# Patient Record
Sex: Female | Born: 1990 | Race: White | Hispanic: No | Marital: Married | Smoking: Current every day smoker
Health system: Southern US, Community
[De-identification: ages and names within clinical notes are randomized; demographics above are authoritative.]

## PROBLEM LIST (undated history)

## (undated) DIAGNOSIS — K219 Gastro-esophageal reflux disease without esophagitis: Secondary | ICD-10-CM

## (undated) DIAGNOSIS — I2699 Other pulmonary embolism without acute cor pulmonale: Secondary | ICD-10-CM

## (undated) HISTORY — PX: CHOLECYSTECTOMY: SHX55

---

## 2011-02-27 ENCOUNTER — Emergency Department: Payer: Self-pay | Admitting: Emergency Medicine

## 2012-10-09 ENCOUNTER — Emergency Department: Payer: Self-pay | Admitting: Emergency Medicine

## 2012-10-09 LAB — URINALYSIS, COMPLETE
Bilirubin,UR: NEGATIVE
Blood: NEGATIVE
Leukocyte Esterase: NEGATIVE
Nitrite: NEGATIVE
Protein: NEGATIVE
Squamous Epithelial: <1
WBC UR: 1 /HPF (ref 0–5)

## 2012-10-09 LAB — COMPREHENSIVE METABOLIC PANEL
Alkaline Phosphatase: 84 U/L (ref 50–136)
Anion Gap: 9 (ref 7–16)
BUN: 13 mg/dL (ref 7–18)
Bilirubin,Total: <0.1 mg/dL — ABNORMAL LOW (ref 0.2–1.0)
Calcium, Total: 8.5 mg/dL (ref 8.5–10.1)
Co2: 20 mmol/L — ABNORMAL LOW (ref 21–32)
Creatinine: 0.79 mg/dL (ref 0.60–1.30)
EGFR (African American): >60
EGFR (Non-African Amer.): >60
Glucose: 92 mg/dL (ref 65–99)
Potassium: 4.1 mmol/L (ref 3.5–5.1)
SGOT(AST): 23 U/L (ref 15–37)
SGPT (ALT): 32 U/L (ref 12–78)
Total Protein: 7.4 g/dL (ref 6.4–8.2)

## 2012-10-09 LAB — CBC
HCT: 39.3 % (ref 35.0–47.0)
HGB: 13.5 g/dL (ref 12.0–16.0)
MCV: 88 fL (ref 80–100)

## 2012-10-09 LAB — LIPASE, BLOOD: Lipase: 109 U/L (ref 73–393)

## 2013-07-07 ENCOUNTER — Emergency Department: Payer: Self-pay | Admitting: Emergency Medicine

## 2013-07-07 LAB — CBC
HGB: 13 g/dL (ref 12.0–16.0)
MCH: 30.3 pg (ref 26.0–34.0)
MCV: 87 fL (ref 80–100)
Platelet: 322 10*3/uL (ref 150–440)
RBC: 4.28 10*6/uL (ref 3.80–5.20)
WBC: 13.2 10*3/uL — ABNORMAL HIGH (ref 3.6–11.0)

## 2013-07-07 LAB — COMPREHENSIVE METABOLIC PANEL
Alkaline Phosphatase: 91 U/L (ref 50–136)
Anion Gap: 7 (ref 7–16)
BUN: 15 mg/dL (ref 7–18)
Calcium, Total: 8.8 mg/dL (ref 8.5–10.1)
Co2: 21 mmol/L (ref 21–32)
EGFR (Non-African Amer.): >60
Glucose: 125 mg/dL — ABNORMAL HIGH (ref 65–99)
Potassium: 4 mmol/L (ref 3.5–5.1)
SGOT(AST): 24 U/L (ref 15–37)
SGPT (ALT): 23 U/L (ref 12–78)
Sodium: 139 mmol/L (ref 136–145)

## 2013-07-07 LAB — URINALYSIS, COMPLETE
Bilirubin,UR: NEGATIVE
Glucose,UR: NEGATIVE mg/dL (ref 0–75)
Ketone: NEGATIVE
Ph: 5 (ref 4.5–8.0)
Protein: NEGATIVE
RBC,UR: 1 /HPF (ref 0–5)
Specific Gravity: 1.028 (ref 1.003–1.030)
WBC UR: <1 /HPF (ref 0–5)

## 2013-07-07 LAB — PREGNANCY, URINE: Pregnancy Test, Urine: NEGATIVE m[IU]/mL

## 2016-12-28 ENCOUNTER — Emergency Department
Admission: EM | Admit: 2016-12-28 | Discharge: 2016-12-28 | Disposition: A | Discharge status: Home / Self Care | Payer: Self-pay | Attending: Emergency Medicine | Admitting: Emergency Medicine

## 2016-12-28 ENCOUNTER — Emergency Department: Payer: Self-pay

## 2016-12-28 DIAGNOSIS — N200 Calculus of kidney: Secondary | ICD-10-CM | POA: Insufficient documentation

## 2016-12-28 DIAGNOSIS — F17210 Nicotine dependence, cigarettes, uncomplicated: Secondary | ICD-10-CM | POA: Insufficient documentation

## 2016-12-28 HISTORY — DX: Gastro-esophageal reflux disease without esophagitis: K21.9

## 2016-12-28 LAB — URINALYSIS, COMPLETE (UACMP) WITH MICROSCOPIC
Bacteria, UA: NONE SEEN
Bilirubin Urine: NEGATIVE
GLUCOSE, UA: NEGATIVE mg/dL
Ketones, ur: 5 mg/dL — AB
Leukocytes, UA: NEGATIVE
NITRITE: NEGATIVE
PH: 5 (ref 5.0–8.0)
Protein, ur: 100 mg/dL — AB
Specific Gravity, Urine: 1.026 (ref 1.005–1.030)

## 2016-12-28 LAB — BASIC METABOLIC PANEL
Anion gap: 9 (ref 5–15)
BUN: 14 mg/dL (ref 6–20)
CALCIUM: 9.8 mg/dL (ref 8.9–10.3)
CO2: 21 mmol/L — AB (ref 22–32)
CREATININE: 0.98 mg/dL (ref 0.44–1.00)
Chloride: 108 mmol/L (ref 101–111)
GFR calc Af Amer: >60 mL/min (ref >60)
GFR calc non Af Amer: >60 mL/min (ref >60)
GLUCOSE: 118 mg/dL — AB (ref 65–99)
Potassium: 4.2 mmol/L (ref 3.5–5.1)
Sodium: 138 mmol/L (ref 135–145)

## 2016-12-28 LAB — CBC WITH DIFFERENTIAL/PLATELET
Basophils Absolute: 0.1 10*3/uL (ref 0–0.1)
Basophils Relative: 1 %
EOS PCT: 2 %
Eosinophils Absolute: 0.2 10*3/uL (ref 0–0.7)
HEMATOCRIT: 42.2 % (ref 35.0–47.0)
Hemoglobin: 14.6 g/dL (ref 12.0–16.0)
LYMPHS PCT: 24 %
Lymphs Abs: 3 10*3/uL (ref 1.0–3.6)
MCH: 31.4 pg (ref 26.0–34.0)
MCHC: 34.7 g/dL (ref 32.0–36.0)
MCV: 90.5 fL (ref 80.0–100.0)
MONO ABS: 0.7 10*3/uL (ref 0.2–0.9)
MONOS PCT: 5 %
NEUTROS ABS: 9 10*3/uL — AB (ref 1.4–6.5)
Neutrophils Relative %: 68 %
Platelets: 363 10*3/uL (ref 150–440)
RBC: 4.67 MIL/uL (ref 3.80–5.20)
RDW: 13.5 % (ref 11.5–14.5)
WBC: 13 10*3/uL — ABNORMAL HIGH (ref 3.6–11.0)

## 2016-12-28 LAB — PREGNANCY, URINE: Preg Test, Ur: NEGATIVE

## 2016-12-28 MED ORDER — OXYCODONE-ACETAMINOPHEN 5-325 MG PO TABS
2.0000 | ORAL_TABLET | Freq: Once | ORAL | Status: AC
  Administered 2016-12-28: 2 via ORAL
  Filled 2016-12-28: qty 2

## 2016-12-28 MED ORDER — SODIUM CHLORIDE 0.9 % IV BOLUS (SEPSIS)
1000.0000 mL | Freq: Once | INTRAVENOUS | Status: AC
  Administered 2016-12-28: 1000 mL via INTRAVENOUS

## 2016-12-28 MED ORDER — KETOROLAC TROMETHAMINE 30 MG/ML IJ SOLN
30.0000 mg | Freq: Once | INTRAMUSCULAR | Status: AC
  Administered 2016-12-28: 30 mg via INTRAVENOUS
  Filled 2016-12-28: qty 1

## 2016-12-28 MED ORDER — HYDROMORPHONE HCL 1 MG/ML IJ SOLN
1.0000 mg | Freq: Once | INTRAMUSCULAR | Status: AC
  Administered 2016-12-28: 1 mg via INTRAVENOUS
  Filled 2016-12-28: qty 1

## 2016-12-28 MED ORDER — OXYCODONE-ACETAMINOPHEN 5-325 MG PO TABS: 1.0000 | ORAL_TABLET | Freq: Four times a day (QID) | ORAL | 0 refills | Status: DC | PRN

## 2016-12-28 MED ORDER — LIDOCAINE HCL (CARDIAC) 20 MG/ML IV SOLN
1.5000 mg/kg | Freq: Once | INTRAVENOUS | Status: AC
  Administered 2016-12-28: 176.8 mg via INTRAVENOUS
  Filled 2016-12-28: qty 10

## 2016-12-28 MED ORDER — TAMSULOSIN HCL 0.4 MG PO CAPS: 0.4000 mg | ORAL_CAPSULE | Freq: Every day | ORAL | 0 refills | Status: DC

## 2016-12-28 MED ORDER — ONDANSETRON HCL 4 MG/2ML IJ SOLN
4.0000 mg | Freq: Once | INTRAMUSCULAR | Status: AC
  Administered 2016-12-28: 4 mg via INTRAVENOUS
  Filled 2016-12-28: qty 2

## 2016-12-28 MED ORDER — ONDANSETRON 4 MG PO TBDP: 4.0000 mg | ORAL_TABLET | Freq: Three times a day (TID) | ORAL | 0 refills | Status: DC | PRN

## 2016-12-28 NOTE — Discharge Instructions (Signed)
Please drink plenty of fluids, take your medications as needed, as prescribed. Please follow-up with urology in 1 week if she continued to have any discomfort. Return to the emergency department for any increase in pain, fever, or pain with urination.

## 2016-12-28 NOTE — ED Provider Notes (Signed)
[facility] Emergency Department Provider Note  Time seen: 3:54 PM  I have reviewed the triage vital signs and the nursing notes.   HISTORY  Chief Complaint Flank Pain    HPI Kristina Crosby is a 26 y.o. female with a past medical history of gastric reflux who presents to the emergency department with sharp severe right flank pain. According to the patient approximately 1-2 hours prior to arrival she developed sudden onset of sharp 10/10 pain to her right flank radiating into the right groin. Denies any similar pain in the past. Patient is status post cholecystectomy. Denies any known fever. Has not urinated yet. Patient is currently pacing around the room due to the pain. Patient states significant nausea as well.  Past Medical History:  Diagnosis Date  . GERD (gastroesophageal reflux disease)     There are no active problems to display for this patient.   Past Surgical History:  Procedure Laterality Date  . CHOLECYSTECTOMY      Prior to Admission medications   Not on File    No Known Allergies  Family History  Problem Relation Age of Onset  . Diabetes Father     Social History Social History  Substance Use Topics  . Smoking status: Current Every Day Smoker    Types: Cigarettes  . Smokeless tobacco: Never Used  . Alcohol use No    Review of Systems Constitutional: Negative for fever Cardiovascular: Negative for chest pain. Respiratory: Negative for shortness of breath. Gastrointestinal: Right flank pain. Positive for nausea. Negative for vomiting. Patient does state intermittent diarrhea over the past 2-3 days. Genitourinary: Negative for dysuria. However the patient has not urinated since the pain began. Musculoskeletal: Positive for right back pain Neurological: Negative for headache 10-point ROS otherwise negative.  ____________________________________________   PHYSICAL EXAM:  VITAL SIGNS: ED Triage Vitals  Enc Vitals  Group     BP 12/28/16 1436 (!) 156/87     Pulse Rate 12/28/16 1436 90     Resp 12/28/16 1436 20     Temp 12/28/16 1436 98.4 F (36.9 C)     Temp Source 12/28/16 1436 Oral     SpO2 12/28/16 1436 98 %     Weight 12/28/16 1437 260 lb (117.9 kg)     Height 12/28/16 1437  (1.6 m)     Head Circumference --      Peak Flow --      Pain Score 12/28/16 1436 10     Pain Loc --      Pain Edu? --      Excl. in GC? --     Constitutional: Alert and oriented. Moderate distress due to pain. Eyes: Normal exam ENT   Head: Normocephalic and atraumatic.   Mouth/Throat: Mucous membranes are moist. Cardiovascular: Normal rate, regular rhythm. No murmur Respiratory: Normal respiratory effort without tachypnea nor retractions. Breath sounds are clear  Gastrointestinal: Soft, mild right lower and upper quadrant tenderness to palpation. No rebound or guarding. No distention. No CVA tenderness. Musculoskeletal: Nontender with normal range of motion in all extremities.  Neurologic:  Normal speech and language. No gross focal neurologic deficits  Skin:  Skin is warm, dry and intact.  Psychiatric: Mood and affect are normal. Speech and behavior are normal.   ____________________________________________   RADIOLOGY  IMPRESSION: 1. Moderate right hydronephrosis and hydroureter associated with a 2 mm right distal ureteral calculus located 1.8 cm proximal to the right UVJ. No other renal calculi. 2. Diffuse hepatic steatosis.  3. There are a few scattered diverticula of the transverse colon, not inflamed.  ____________________________________________   INITIAL IMPRESSION / ASSESSMENT AND PLAN / ED COURSE  Pertinent labs & imaging results that were available during my care of the patient were reviewed by me and considered in my medical decision making (see chart for details).  Patient presents the emergency department for significant right flank pain starting 1-2 hours prior to arrival.  Patient's symptoms are very suggestive of ureteral lithiasis although no history. We will check labs, CT renal scan and closely monitor. We'll dose pain and nausea medication as well as IV hydrate while awaiting results. Patient agreeable to plan.  Labs show significant hematuria again suggestive of ureterolithiasis. Urine pregnancy test is been added on to the patient's lab work. CT pending.  CT consistent with 2 mm right distal ureteral stone. Patient's pain much improved after Toradol and IV lidocaine. We will discharge with Percocet, Zofran and Flomax. Patient agreeable. ____________________________________________   FINAL CLINICAL IMPRESSION(S) / ED DIAGNOSES  Right flank pain    Minna Antis, MD 12/28/16 1909

## 2016-12-28 NOTE — ED Triage Notes (Signed)
Pt to ED via pov c/o right flank pain. Pt reports right sided flank pain that began suddenly one hour ago, and radiates from back to RLQ. Pt reports nausea denies vomiting, and diarrhea x3 days. Pt tearful and restless during triage, in no acute distress at this time.

## 2016-12-28 NOTE — ED Notes (Signed)
Pt tearful and pacing the floor while guarding the right side of her back.

## 2016-12-28 NOTE — ED Notes (Signed)
Pt discharged to home.  Significant other driving.  Discharge instructions reviewed.  Verbalized understanding.  No questions or concerns at this time.  Teach back verified.  Pt in NAD.  No items left in ED.   

## 2016-12-30 LAB — URINE CULTURE

## 2017-01-08 ENCOUNTER — Emergency Department
Admission: EM | Admit: 2017-01-08 | Discharge: 2017-01-08 | Disposition: A | Discharge status: Home / Self Care | Payer: Self-pay | Attending: Emergency Medicine | Admitting: Emergency Medicine

## 2017-01-08 ENCOUNTER — Emergency Department: Payer: Self-pay

## 2017-01-08 DIAGNOSIS — F17210 Nicotine dependence, cigarettes, uncomplicated: Secondary | ICD-10-CM | POA: Insufficient documentation

## 2017-01-08 DIAGNOSIS — N1339 Other hydronephrosis: Secondary | ICD-10-CM | POA: Insufficient documentation

## 2017-01-08 DIAGNOSIS — R109 Unspecified abdominal pain: Secondary | ICD-10-CM

## 2017-01-08 LAB — BASIC METABOLIC PANEL
Anion gap: 10 (ref 5–15)
BUN: 14 mg/dL (ref 6–20)
CHLORIDE: 98 mmol/L — AB (ref 101–111)
CO2: 24 mmol/L (ref 22–32)
Calcium: 9.4 mg/dL (ref 8.9–10.3)
Creatinine, Ser: 1.24 mg/dL — ABNORMAL HIGH (ref 0.44–1.00)
Glucose, Bld: 108 mg/dL — ABNORMAL HIGH (ref 65–99)
POTASSIUM: 3.4 mmol/L — AB (ref 3.5–5.1)
SODIUM: 132 mmol/L — AB (ref 135–145)

## 2017-01-08 LAB — CBC
HEMATOCRIT: 41.1 % (ref 35.0–47.0)
Hemoglobin: 14.3 g/dL (ref 12.0–16.0)
MCH: 31 pg (ref 26.0–34.0)
MCHC: 34.7 g/dL (ref 32.0–36.0)
MCV: 89.3 fL (ref 80.0–100.0)
PLATELETS: 360 10*3/uL (ref 150–440)
RBC: 4.6 MIL/uL (ref 3.80–5.20)
RDW: 13.3 % (ref 11.5–14.5)
WBC: 13 10*3/uL — AB (ref 3.6–11.0)

## 2017-01-08 MED ORDER — OXYCODONE-ACETAMINOPHEN 5-325 MG PO TABS
1.0000 | ORAL_TABLET | Freq: Once | ORAL | Status: AC
  Administered 2017-01-08: 1 via ORAL
  Filled 2017-01-08: qty 1

## 2017-01-08 MED ORDER — ONDANSETRON HCL 4 MG/2ML IJ SOLN
INTRAMUSCULAR | Status: AC
  Administered 2017-01-08: 4 mg via INTRAVENOUS
  Filled 2017-01-08: qty 2

## 2017-01-08 MED ORDER — OXYCODONE-ACETAMINOPHEN 5-325 MG PO TABS: 1.0000 | ORAL_TABLET | Freq: Four times a day (QID) | ORAL | 0 refills | Status: DC | PRN

## 2017-01-08 MED ORDER — SODIUM CHLORIDE 0.9 % IV BOLUS (SEPSIS)
1000.0000 mL | Freq: Once | INTRAVENOUS | Status: AC
  Administered 2017-01-08: 1000 mL via INTRAVENOUS

## 2017-01-08 MED ORDER — MORPHINE SULFATE (PF) 4 MG/ML IV SOLN
4.0000 mg | Freq: Once | INTRAVENOUS | Status: AC
  Administered 2017-01-08: 4 mg via INTRAVENOUS
  Filled 2017-01-08: qty 1

## 2017-01-08 MED ORDER — KETOROLAC TROMETHAMINE 30 MG/ML IJ SOLN
INTRAMUSCULAR | Status: AC
  Administered 2017-01-08: 30 mg via INTRAVENOUS
  Filled 2017-01-08: qty 1

## 2017-01-08 MED ORDER — KETOROLAC TROMETHAMINE 30 MG/ML IJ SOLN
30.0000 mg | Freq: Once | INTRAMUSCULAR | Status: AC
  Administered 2017-01-08: 30 mg via INTRAVENOUS

## 2017-01-08 MED ORDER — ONDANSETRON HCL 4 MG/2ML IJ SOLN
4.0000 mg | Freq: Once | INTRAMUSCULAR | Status: AC
  Administered 2017-01-08: 4 mg via INTRAVENOUS

## 2017-01-08 MED ORDER — TAMSULOSIN HCL 0.4 MG PO CAPS: 0.4000 mg | ORAL_CAPSULE | Freq: Every day | ORAL | 0 refills | Status: DC

## 2017-01-08 NOTE — ED Notes (Signed)
Resumed care from [place], [place]. Assisted pt to toilet. No other needs expressed at this time. Will continue to monitor.

## 2017-01-08 NOTE — ED Notes (Signed)
Pt discharged home after verbalizing understanding of discharge instructions; nad noted. 

## 2017-01-08 NOTE — ED Provider Notes (Signed)
[facility] Emergency Department Provider Note   ____________________________________________   First MD Initiated Contact with Patient 01/08/17 [phone]     (approximate)  I have reviewed the triage vital signs and the nursing notes.   HISTORY  Chief Complaint Flank Pain and Nephrolithiasis    HPI Kristina Crosby is a 26 y.o. female who comes into the hospital today with some right-sided flank pain. The patient reports it started tonight at 8 PM. She is unsure if she passed her previous stone. The patient was seen on 4/15 2018. She was diagnosed with a 2 mm stone on the right.The patient reports that she had been doing well up until this evening. The patient has some nausea and vomiting but denies any fevers. The patient's pain is a 10 out of 10 in intensity. She has had some pain with urination but denies any blood in her urine. The patient did not take any medication for this pain at home. She thought it would get better. She is here today for evaluation.   Past Medical History:  Diagnosis Date  . GERD (gastroesophageal reflux disease)     There are no active problems to display for this patient.   Past Surgical History:  Procedure Laterality Date  . CHOLECYSTECTOMY      Prior to Admission medications   Medication Sig Start Date End Date Taking? Authorizing Provider  ondansetron (ZOFRAN ODT) 4 MG disintegrating tablet Take 1 tablet (4 mg total) by mouth every 8 (eight) hours as needed for nausea or vomiting. 12/28/16   Minna Antis, MD  oxyCODONE-acetaminophen (ROXICET) 5-325 MG tablet Take 1 tablet by mouth every 6 (six) hours as needed. 12/28/16   Minna Antis, MD  tamsulosin (FLOMAX) 0.4 MG CAPS capsule Take 1 capsule (0.4 mg total) by mouth daily. 12/28/16   Minna Antis, MD    Allergies Patient has no known allergies.  Family History  Problem Relation Age of Onset  . Diabetes Father     Social History Social History    Substance Use Topics  . Smoking status: Current Every Day Smoker    Types: Cigarettes  . Smokeless tobacco: Never Used  . Alcohol use No    Review of Systems  Constitutional: No fever/chills Eyes: No visual changes. ENT: No sore throat. Cardiovascular: Denies chest pain. Respiratory: Denies shortness of breath. Gastrointestinal: Nausea and vomiting with abdominal pain.  No diarrhea.  No constipation. Genitourinary: dysuria. Musculoskeletal: right sided flank pain Skin: Negative for rash. Neurological: Negative for headaches, focal weakness or numbness.   ____________________________________________   PHYSICAL EXAM:  VITAL SIGNS: ED Triage Vitals  Enc Vitals Group     BP 01/08/17 0318 (!) 157/105     Pulse Rate 01/08/17 0318 75     Resp 01/08/17 0318 20     Temp 01/08/17 0318 97.6 F (36.4 C)     Temp Source 01/08/17 0318 Oral     SpO2 01/08/17 0318 100 %     Weight 01/08/17 0319 260 lb (117.9 kg)     Height 01/08/17 0319  (1.626 m)     Head Circumference --      Peak Flow --      Pain Score 01/08/17 0320 10     Pain Loc --      Pain Edu? --      Excl. in GC? --     Constitutional: Alert and oriented. Well appearing and in moderate to severe distress. Eyes: Conjunctivae are normal. PERRL. EOMI.  Head: Atraumatic. Nose: No congestion/rhinnorhea. Mouth/Throat: Mucous membranes are moist.  Oropharynx non-erythematous. Cardiovascular: Normal rate, regular rhythm. Grossly normal heart sounds.  Good peripheral circulation. Respiratory: Normal respiratory effort.  No retractions. Lungs CTAB. Gastrointestinal: Soft No distention. Right flank and abd pain, positive bowel sounds Musculoskeletal: No lower extremity tenderness nor edema.   Neurologic:  Normal speech and language.  Skin:  Skin is warm, dry and intact.  Psychiatric: Mood and affect are normal.   ____________________________________________   LABS (all labs ordered are listed, but only abnormal  results are displayed)  Labs Reviewed  BASIC METABOLIC PANEL - Abnormal; Notable for the following:       Result Value   Sodium 132 (*)    Potassium 3.4 (*)    Chloride 98 (*)    Glucose, Bld 108 (*)    Creatinine, Ser 1.24 (*)    All other components within normal limits  CBC - Abnormal; Notable for the following:    WBC 13.0 (*)    All other components within normal limits  URINALYSIS, COMPLETE (UACMP) WITH MICROSCOPIC   ____________________________________________  EKG  none ____________________________________________  RADIOLOGY  US renal ____________________________________________   PROCEDURES  Procedure(s) performed: None  Procedures  Critical Care performed: No  ____________________________________________   INITIAL IMPRESSION / ASSESSMENT AND PLAN / ED COURSE  Pertinent labs & imaging results that were available during my care of the patient were reviewed by me and considered in my medical decision making (see chart for details).  This is a 26 year old who comes into the hospital today with some right-sided flank pain. The patient was diagnosed with a kidney stone approximately 11 days ago. She is unsure if she has passed her stone already. I did give the patient a dose of Toradol but then some morphine given her pain. I will send the patient for an ultrasound to evaluate her kidneys and then reassess her.  Clinical Course as of Jan 09 716  Thu Jan 08, 2017  0641 Moderate right and mild left hydronephrosis. Both ureteral jets were visualized.   US Renal [AW]    Clinical Course User Index [AW] Rebecka Apley, MD   The patient's pain is improved after the morphine and Toradol. I'll give the patient a dose of Percocet. I will give the patient another course of Flomax and pain medication and I will have the patient follow-up with urology. The patient understands and agrees with this plan as  stated.  ____________________________________________   FINAL CLINICAL IMPRESSION(S) / ED DIAGNOSES  Final diagnoses:  Right flank pain  Other hydronephrosis      NEW MEDICATIONS STARTED DURING THIS VISIT:  New Prescriptions   No medications on file     Note:  This document was prepared using Dragon voice recognition software and may include unintentional dictation errors.    Rebecka Apley, MD 01/08/17 [phone]

## 2017-01-08 NOTE — ED Triage Notes (Signed)
Pt presents to ED with right sided flank pain. Recently dx with kidney stone and pt states she is not sure if she passed the stone yet. Tearful in triage.

## 2017-09-15 ENCOUNTER — Other Ambulatory Visit: Payer: Self-pay

## 2017-09-15 ENCOUNTER — Emergency Department: Payer: Self-pay

## 2017-09-15 ENCOUNTER — Observation Stay: Payer: Self-pay

## 2017-09-15 ENCOUNTER — Encounter
Admission: EM | Disposition: A | Discharge status: Home / Self Care | Payer: Self-pay | Source: Home / Self Care | Attending: Emergency Medicine

## 2017-09-15 ENCOUNTER — Encounter: Payer: Self-pay | Admitting: *Deleted

## 2017-09-15 ENCOUNTER — Observation Stay
Admission: EM | Admit: 2017-09-15 | Discharge: 2017-09-16 | Disposition: A | Discharge status: Home / Self Care | Payer: Self-pay | Attending: Surgery | Admitting: Surgery

## 2017-09-15 ENCOUNTER — Observation Stay: Payer: Self-pay | Admitting: Anesthesiology

## 2017-09-15 DIAGNOSIS — Z419 Encounter for procedure for purposes other than remedying health state, unspecified: Secondary | ICD-10-CM

## 2017-09-15 DIAGNOSIS — W19XXXA Unspecified fall, initial encounter: Secondary | ICD-10-CM | POA: Insufficient documentation

## 2017-09-15 DIAGNOSIS — Z9049 Acquired absence of other specified parts of digestive tract: Secondary | ICD-10-CM | POA: Insufficient documentation

## 2017-09-15 DIAGNOSIS — R52 Pain, unspecified: Secondary | ICD-10-CM

## 2017-09-15 DIAGNOSIS — S9301XA Subluxation of right ankle joint, initial encounter: Secondary | ICD-10-CM | POA: Insufficient documentation

## 2017-09-15 DIAGNOSIS — Z86711 Personal history of pulmonary embolism: Secondary | ICD-10-CM | POA: Insufficient documentation

## 2017-09-15 DIAGNOSIS — S82841A Displaced bimalleolar fracture of right lower leg, initial encounter for closed fracture: Principal | ICD-10-CM | POA: Diagnosis present

## 2017-09-15 HISTORY — DX: Other pulmonary embolism without acute cor pulmonale: I26.99

## 2017-09-15 HISTORY — PX: ORIF ANKLE FRACTURE: SHX5408

## 2017-09-15 LAB — CBC WITH DIFFERENTIAL/PLATELET
BASOS ABS: 0 10*3/uL (ref 0–0.1)
Basophils Relative: 0 %
EOS PCT: 1 %
Eosinophils Absolute: 0.1 10*3/uL (ref 0–0.7)
HCT: 38.2 % (ref 35.0–47.0)
HEMOGLOBIN: 13.1 g/dL (ref 12.0–16.0)
LYMPHS ABS: 4.1 10*3/uL — AB (ref 1.0–3.6)
LYMPHS PCT: 33 %
MCH: 31.1 pg (ref 26.0–34.0)
MCHC: 34.3 g/dL (ref 32.0–36.0)
MCV: 90.7 fL (ref 80.0–100.0)
Monocytes Absolute: 0.7 10*3/uL (ref 0.2–0.9)
Monocytes Relative: 5 %
NEUTROS ABS: 7.8 10*3/uL — AB (ref 1.4–6.5)
NEUTROS PCT: 61 %
PLATELETS: 359 10*3/uL (ref 150–440)
RBC: 4.21 MIL/uL (ref 3.80–5.20)
RDW: 13.4 % (ref 11.5–14.5)
WBC: 12.8 10*3/uL — AB (ref 3.6–11.0)

## 2017-09-15 LAB — CBC
HCT: 41.4 % (ref 35.0–47.0)
Hemoglobin: 14.1 g/dL (ref 12.0–16.0)
MCH: 30.9 pg (ref 26.0–34.0)
MCHC: 34.1 g/dL (ref 32.0–36.0)
MCV: 90.7 fL (ref 80.0–100.0)
PLATELETS: 349 10*3/uL (ref 150–440)
RBC: 4.57 MIL/uL (ref 3.80–5.20)
RDW: 13.5 % (ref 11.5–14.5)
WBC: 13.6 10*3/uL — AB (ref 3.6–11.0)

## 2017-09-15 LAB — BASIC METABOLIC PANEL
Anion gap: 8 (ref 5–15)
BUN: 9 mg/dL (ref 6–20)
CO2: 21 mmol/L — ABNORMAL LOW (ref 22–32)
Calcium: 8 mg/dL — ABNORMAL LOW (ref 8.9–10.3)
Chloride: 112 mmol/L — ABNORMAL HIGH (ref 101–111)
Creatinine, Ser: 0.68 mg/dL (ref 0.44–1.00)
GFR calc Af Amer: >60 mL/min (ref >60)
GLUCOSE: 90 mg/dL (ref 65–99)
POTASSIUM: 3.7 mmol/L (ref 3.5–5.1)
Sodium: 141 mmol/L (ref 135–145)

## 2017-09-15 LAB — ETHANOL: Alcohol, Ethyl (B): 207 mg/dL — ABNORMAL HIGH (ref <10)

## 2017-09-15 LAB — SURGICAL PCR SCREEN
MRSA, PCR: NEGATIVE
Staphylococcus aureus: NEGATIVE

## 2017-09-15 LAB — PREGNANCY, URINE: PREG TEST UR: NEGATIVE

## 2017-09-15 LAB — HCG, QUANTITATIVE, PREGNANCY: hCG, Beta Chain, Quant, S: <1 m[IU]/mL (ref <5)

## 2017-09-15 SURGERY — OPEN REDUCTION INTERNAL FIXATION (ORIF) ANKLE FRACTURE
Anesthesia: General | Site: Ankle | Laterality: Right | Wound class: Clean

## 2017-09-15 MED ORDER — ACETAMINOPHEN 650 MG RE SUPP: 650.0000 mg | RECTAL | Status: DC | PRN

## 2017-09-15 MED ORDER — MIDAZOLAM HCL 2 MG/2ML IJ SOLN
2.0000 mg | Freq: Once | INTRAMUSCULAR | Status: AC
  Administered 2017-09-15: 2 mg via INTRAVENOUS
  Filled 2017-09-15: qty 2

## 2017-09-15 MED ORDER — ETOMIDATE 2 MG/ML IV SOLN: 0.3000 mg/kg | Freq: Once | INTRAVENOUS | Status: DC

## 2017-09-15 MED ORDER — DEXTROSE 5 % IV SOLN
2.0000 g | Freq: Once | INTRAVENOUS | Status: AC
  Administered 2017-09-15: 2 g via INTRAVENOUS
  Filled 2017-09-15: qty 20

## 2017-09-15 MED ORDER — SUCCINYLCHOLINE CHLORIDE 20 MG/ML IJ SOLN
INTRAMUSCULAR | Status: DC | PRN
  Administered 2017-09-15: 120 mg via INTRAVENOUS

## 2017-09-15 MED ORDER — FENTANYL CITRATE (PF) 100 MCG/2ML IJ SOLN
50.0000 ug | Freq: Once | INTRAMUSCULAR | Status: AC
  Administered 2017-09-15: 50 ug via INTRAVENOUS

## 2017-09-15 MED ORDER — FLEET ENEMA 7-19 GM/118ML RE ENEM: 1.0000 | ENEMA | Freq: Once | RECTAL | Status: DC | PRN

## 2017-09-15 MED ORDER — OXYCODONE HCL 5 MG PO TABS: 5.0000 mg | ORAL_TABLET | Freq: Once | ORAL | Status: DC | PRN

## 2017-09-15 MED ORDER — ETOMIDATE 2 MG/ML IV SOLN
INTRAVENOUS | Status: AC | PRN
  Administered 2017-09-15: 10 mg via INTRAVENOUS

## 2017-09-15 MED ORDER — DEXTROSE 5 % IV SOLN
2.0000 g | Freq: Four times a day (QID) | INTRAVENOUS | Status: AC
  Administered 2017-09-15 – 2017-09-16 (×3): 2 g via INTRAVENOUS
  Filled 2017-09-15 (×3): qty 20

## 2017-09-15 MED ORDER — FENTANYL CITRATE (PF) 100 MCG/2ML IJ SOLN
INTRAMUSCULAR | Status: AC
  Administered 2017-09-15: 50 ug via INTRAVENOUS
  Filled 2017-09-15: qty 2

## 2017-09-15 MED ORDER — PANTOPRAZOLE SODIUM 40 MG IV SOLR: 40.0000 mg | Freq: Every day | INTRAVENOUS | Status: DC

## 2017-09-15 MED ORDER — MORPHINE SULFATE (PF) 4 MG/ML IV SOLN
4.0000 mg | Freq: Once | INTRAVENOUS | Status: AC
  Administered 2017-09-15: 4 mg via INTRAVENOUS
  Filled 2017-09-15: qty 1

## 2017-09-15 MED ORDER — KETOROLAC TROMETHAMINE 15 MG/ML IJ SOLN
30.0000 mg | Freq: Once | INTRAMUSCULAR | Status: AC
  Administered 2017-09-15: 30 mg via INTRAVENOUS
  Filled 2017-09-15: qty 2

## 2017-09-15 MED ORDER — ACETAMINOPHEN 325 MG PO TABS: 650.0000 mg | ORAL_TABLET | ORAL | Status: DC | PRN

## 2017-09-15 MED ORDER — ONDANSETRON HCL 4 MG/2ML IJ SOLN: 4.0000 mg | Freq: Four times a day (QID) | INTRAMUSCULAR | Status: DC | PRN

## 2017-09-15 MED ORDER — KCL IN DEXTROSE-NACL 20-5-0.9 MEQ/L-%-% IV SOLN
INTRAVENOUS | Status: DC
  Administered 2017-09-15: 21:00:00 via INTRAVENOUS
  Filled 2017-09-15 (×4): qty 1000

## 2017-09-15 MED ORDER — MAGNESIUM HYDROXIDE 400 MG/5ML PO SUSP: 30.0000 mL | Freq: Every day | ORAL | Status: DC | PRN

## 2017-09-15 MED ORDER — LACTATED RINGERS IV SOLN
INTRAVENOUS | Status: DC | PRN
  Administered 2017-09-15: 16:00:00 via INTRAVENOUS

## 2017-09-15 MED ORDER — PROMETHAZINE HCL 25 MG/ML IJ SOLN: 6.2500 mg | INTRAMUSCULAR | Status: DC | PRN

## 2017-09-15 MED ORDER — OXYCODONE HCL 5 MG PO TABS
5.0000 mg | ORAL_TABLET | ORAL | Status: DC | PRN
  Administered 2017-09-15: 10 mg via ORAL
  Filled 2017-09-15: qty 2

## 2017-09-15 MED ORDER — FENTANYL CITRATE (PF) 100 MCG/2ML IJ SOLN: 25.0000 ug | INTRAMUSCULAR | Status: DC | PRN

## 2017-09-15 MED ORDER — ONDANSETRON HCL 4 MG/2ML IJ SOLN
4.0000 mg | Freq: Once | INTRAMUSCULAR | Status: AC
  Administered 2017-09-15: 4 mg via INTRAVENOUS
  Filled 2017-09-15: qty 2

## 2017-09-15 MED ORDER — KETOROLAC TROMETHAMINE 30 MG/ML IJ SOLN
INTRAMUSCULAR | Status: AC
  Filled 2017-09-15: qty 1

## 2017-09-15 MED ORDER — OXYCODONE HCL 5 MG PO TABS: 5.0000 mg | ORAL_TABLET | ORAL | Status: DC | PRN

## 2017-09-15 MED ORDER — BISACODYL 10 MG RE SUPP: 10.0000 mg | Freq: Every day | RECTAL | Status: DC | PRN

## 2017-09-15 MED ORDER — PROPOFOL 10 MG/ML IV BOLUS
INTRAVENOUS | Status: AC
  Filled 2017-09-15: qty 40

## 2017-09-15 MED ORDER — ACETAMINOPHEN 650 MG RE SUPP: 650.0000 mg | Freq: Four times a day (QID) | RECTAL | Status: DC | PRN

## 2017-09-15 MED ORDER — MEPERIDINE HCL 50 MG/ML IJ SOLN: 6.2500 mg | INTRAMUSCULAR | Status: DC | PRN

## 2017-09-15 MED ORDER — DOCUSATE SODIUM 100 MG PO CAPS
100.0000 mg | ORAL_CAPSULE | Freq: Two times a day (BID) | ORAL | Status: DC
  Administered 2017-09-15 – 2017-09-16 (×2): 100 mg via ORAL
  Filled 2017-09-15 (×2): qty 1

## 2017-09-15 MED ORDER — KCL IN DEXTROSE-NACL 20-5-0.9 MEQ/L-%-% IV SOLN
INTRAVENOUS | Status: DC
  Administered 2017-09-15: 10:00:00 via INTRAVENOUS
  Filled 2017-09-15 (×4): qty 1000

## 2017-09-15 MED ORDER — HYDROMORPHONE HCL 1 MG/ML IJ SOLN
INTRAMUSCULAR | Status: AC
Start: 2017-09-15 — End: 2017-09-15
  Filled 2017-09-15: qty 1

## 2017-09-15 MED ORDER — NEOMYCIN-POLYMYXIN B GU 40-200000 IR SOLN
Status: DC | PRN
  Administered 2017-09-15: 4 mL

## 2017-09-15 MED ORDER — MORPHINE SULFATE (PF) 2 MG/ML IV SOLN: 2.0000 mg | INTRAVENOUS | Status: DC | PRN

## 2017-09-15 MED ORDER — ACETAMINOPHEN 10 MG/ML IV SOLN
INTRAVENOUS | Status: DC | PRN
  Administered 2017-09-15: 1000 mg via INTRAVENOUS

## 2017-09-15 MED ORDER — FENTANYL CITRATE (PF) 100 MCG/2ML IJ SOLN
INTRAMUSCULAR | Status: AC
  Filled 2017-09-15: qty 2

## 2017-09-15 MED ORDER — PHENYLEPHRINE HCL 10 MG/ML IJ SOLN
INTRAMUSCULAR | Status: DC | PRN
  Administered 2017-09-15 (×4): 100 ug via INTRAVENOUS

## 2017-09-15 MED ORDER — LIDOCAINE HCL (PF) 1 % IJ SOLN
INTRAMUSCULAR | Status: DC | PRN
  Administered 2017-09-15: 3 mL

## 2017-09-15 MED ORDER — KETOROLAC TROMETHAMINE 15 MG/ML IJ SOLN
15.0000 mg | Freq: Four times a day (QID) | INTRAMUSCULAR | Status: DC
  Administered 2017-09-16 (×2): 15 mg via INTRAVENOUS
  Filled 2017-09-15 (×2): qty 1

## 2017-09-15 MED ORDER — DEXAMETHASONE SODIUM PHOSPHATE 10 MG/ML IJ SOLN
INTRAMUSCULAR | Status: DC | PRN
  Administered 2017-09-15: 5 mg via INTRAVENOUS

## 2017-09-15 MED ORDER — ETOMIDATE 2 MG/ML IV SOLN
10.0000 mg | Freq: Once | INTRAVENOUS | Status: AC
  Administered 2017-09-15: 10 mg via INTRAVENOUS
  Filled 2017-09-15: qty 10

## 2017-09-15 MED ORDER — METOCLOPRAMIDE HCL 5 MG/ML IJ SOLN: 5.0000 mg | Freq: Three times a day (TID) | INTRAMUSCULAR | Status: DC | PRN

## 2017-09-15 MED ORDER — PANTOPRAZOLE SODIUM 40 MG PO TBEC
40.0000 mg | DELAYED_RELEASE_TABLET | Freq: Every day | ORAL | Status: DC
  Administered 2017-09-15 – 2017-09-16 (×2): 40 mg via ORAL
  Filled 2017-09-15 (×2): qty 1

## 2017-09-15 MED ORDER — DOCUSATE SODIUM 100 MG PO CAPS: 100.0000 mg | ORAL_CAPSULE | Freq: Two times a day (BID) | ORAL | Status: DC

## 2017-09-15 MED ORDER — HYDROMORPHONE HCL 1 MG/ML IJ SOLN
INTRAMUSCULAR | Status: DC | PRN
  Administered 2017-09-15 (×2): 0.5 mg via INTRAVENOUS

## 2017-09-15 MED ORDER — ROPIVACAINE HCL 5 MG/ML IJ SOLN
INTRAMUSCULAR | Status: DC | PRN
  Administered 2017-09-15: 30 mL via PERINEURAL
  Administered 2017-09-15: 10 mL

## 2017-09-15 MED ORDER — MORPHINE SULFATE (PF) 4 MG/ML IV SOLN
INTRAVENOUS | Status: AC
  Filled 2017-09-15: qty 1

## 2017-09-15 MED ORDER — KETOROLAC TROMETHAMINE 30 MG/ML IJ SOLN
INTRAMUSCULAR | Status: DC | PRN
  Administered 2017-09-15: 30 mg via INTRAVENOUS

## 2017-09-15 MED ORDER — ACETAMINOPHEN 10 MG/ML IV SOLN
INTRAVENOUS | Status: AC
  Filled 2017-09-15: qty 100

## 2017-09-15 MED ORDER — ROPIVACAINE HCL 5 MG/ML IJ SOLN
INTRAMUSCULAR | Status: AC
  Filled 2017-09-15: qty 60

## 2017-09-15 MED ORDER — ACETAMINOPHEN 325 MG PO TABS: 650.0000 mg | ORAL_TABLET | Freq: Four times a day (QID) | ORAL | Status: DC | PRN

## 2017-09-15 MED ORDER — BUPIVACAINE HCL 0.5 % IJ SOLN
INTRAMUSCULAR | Status: DC | PRN
  Administered 2017-09-15: 20 mL

## 2017-09-15 MED ORDER — ENOXAPARIN SODIUM 40 MG/0.4ML ~~LOC~~ SOLN
40.0000 mg | Freq: Two times a day (BID) | SUBCUTANEOUS | Status: DC
  Administered 2017-09-16: 40 mg via SUBCUTANEOUS
  Filled 2017-09-15: qty 0.4

## 2017-09-15 MED ORDER — DIPHENHYDRAMINE HCL 12.5 MG/5ML PO ELIX: 12.5000 mg | ORAL_SOLUTION | ORAL | Status: DC | PRN

## 2017-09-15 MED ORDER — PROPOFOL 10 MG/ML IV BOLUS
INTRAVENOUS | Status: DC | PRN
  Administered 2017-09-15: 200 mg via INTRAVENOUS

## 2017-09-15 MED ORDER — ONDANSETRON HCL 4 MG/2ML IJ SOLN
INTRAMUSCULAR | Status: DC | PRN
  Administered 2017-09-15: 4 mg via INTRAVENOUS

## 2017-09-15 MED ORDER — FENTANYL CITRATE (PF) 100 MCG/2ML IJ SOLN
INTRAMUSCULAR | Status: DC | PRN
  Administered 2017-09-15 (×2): 50 ug via INTRAVENOUS

## 2017-09-15 MED ORDER — ACETAMINOPHEN 500 MG PO TABS
1000.0000 mg | ORAL_TABLET | Freq: Four times a day (QID) | ORAL | Status: DC
  Administered 2017-09-15 – 2017-09-16 (×3): 1000 mg via ORAL
  Filled 2017-09-15 (×3): qty 2

## 2017-09-15 MED ORDER — SODIUM CHLORIDE 0.9 % IV BOLUS (SEPSIS)
1000.0000 mL | Freq: Once | INTRAVENOUS | Status: AC
  Administered 2017-09-15: 1000 mL via INTRAVENOUS

## 2017-09-15 MED ORDER — OXYCODONE HCL 5 MG PO TABS
10.0000 mg | ORAL_TABLET | ORAL | Status: DC | PRN
  Administered 2017-09-15 – 2017-09-16 (×2): 10 mg via ORAL
  Filled 2017-09-15 (×2): qty 2

## 2017-09-15 MED ORDER — MORPHINE SULFATE (PF) 2 MG/ML IV SOLN
2.0000 mg | INTRAVENOUS | Status: DC | PRN
  Administered 2017-09-15: 2 mg via INTRAVENOUS
  Administered 2017-09-15: 4 mg via INTRAVENOUS
  Administered 2017-09-15: 2 mg via INTRAVENOUS
  Filled 2017-09-15 (×2): qty 1
  Filled 2017-09-15: qty 2

## 2017-09-15 MED ORDER — CEFAZOLIN SODIUM-DEXTROSE 2-4 GM/100ML-% IV SOLN: 2.0000 g | Freq: Once | INTRAVENOUS | Status: DC

## 2017-09-15 MED ORDER — METOCLOPRAMIDE HCL 10 MG PO TABS: 5.0000 mg | ORAL_TABLET | Freq: Three times a day (TID) | ORAL | Status: DC | PRN

## 2017-09-15 MED ORDER — MIDAZOLAM HCL 2 MG/2ML IJ SOLN
INTRAMUSCULAR | Status: AC
  Administered 2017-09-15: 2 mg via INTRAVENOUS
  Filled 2017-09-15: qty 2

## 2017-09-15 MED ORDER — ONDANSETRON HCL 4 MG PO TABS: 4.0000 mg | ORAL_TABLET | Freq: Four times a day (QID) | ORAL | Status: DC | PRN

## 2017-09-15 MED ORDER — OXYCODONE HCL 5 MG/5ML PO SOLN: 5.0000 mg | Freq: Once | ORAL | Status: DC | PRN

## 2017-09-15 MED ORDER — MORPHINE SULFATE (PF) 4 MG/ML IV SOLN
4.0000 mg | Freq: Once | INTRAVENOUS | Status: AC
  Administered 2017-09-15: 4 mg via INTRAVENOUS

## 2017-09-15 MED ORDER — LIDOCAINE HCL (PF) 1 % IJ SOLN
INTRAMUSCULAR | Status: AC
  Filled 2017-09-15: qty 5

## 2017-09-15 MED ORDER — LIDOCAINE HCL (CARDIAC) 20 MG/ML IV SOLN
INTRAVENOUS | Status: DC | PRN
  Administered 2017-09-15: 100 mg via INTRAVENOUS

## 2017-09-15 SURGICAL SUPPLY — 61 items
BANDAGE ACE 4X5 VEL STRL LF (GAUZE/BANDAGES/DRESSINGS) ×6 IMPLANT
BANDAGE ACE 6X5 VEL STRL LF (GAUZE/BANDAGES/DRESSINGS) ×3 IMPLANT
BIT DRILL 2.5X2.75 QC CALB (BIT) ×3 IMPLANT
BIT DRILL 2.9 CANN QC NONSTRL (BIT) ×3 IMPLANT
BIT DRILL 3.5X5.5 QC CALB (BIT) ×3 IMPLANT
BIT DRILL CALIBRATED 2.7 (BIT) ×2 IMPLANT
BIT DRILL CALIBRATED 2.7MM (BIT) ×1
BLADE SURG SZ10 CARB STEEL (BLADE) ×6 IMPLANT
BNDG COHESIVE 4X5 TAN STRL (GAUZE/BANDAGES/DRESSINGS) ×3 IMPLANT
BNDG ESMARK 6X12 TAN STRL LF (GAUZE/BANDAGES/DRESSINGS) ×3 IMPLANT
BNDG PLASTER FAST 4X5 WHT LF (CAST SUPPLIES) ×12 IMPLANT
CANISTER SUCT 1200ML W/VALVE (MISCELLANEOUS) ×3 IMPLANT
CHLORAPREP W/TINT 26ML (MISCELLANEOUS) ×6 IMPLANT
CUFF TOURN 24 STER (MISCELLANEOUS) IMPLANT
CUFF TOURN 30 STER DUAL PORT (MISCELLANEOUS) IMPLANT
DRAPE C-ARM XRAY 36X54 (DRAPES) ×3 IMPLANT
DRAPE C-ARMOR (DRAPES) ×3 IMPLANT
DRAPE INCISE IOBAN 66X45 STRL (DRAPES) ×3 IMPLANT
DRAPE U-SHAPE 47X51 STRL (DRAPES) ×3 IMPLANT
ELECT CAUTERY BLADE 6.4 (BLADE) ×3 IMPLANT
ELECT REM PT RETURN 9FT ADLT (ELECTROSURGICAL) ×3
ELECTRODE REM PT RTRN 9FT ADLT (ELECTROSURGICAL) ×1 IMPLANT
GAUZE PETRO XEROFOAM 1X8 (MISCELLANEOUS) ×3 IMPLANT
GAUZE SPONGE 4X4 12PLY STRL (GAUZE/BANDAGES/DRESSINGS) ×3 IMPLANT
GLOVE BIO SURGEON STRL SZ7 (GLOVE) ×3 IMPLANT
GLOVE BIO SURGEON STRL SZ8 (GLOVE) ×6 IMPLANT
GLOVE INDICATOR 8.0 STRL GRN (GLOVE) ×6 IMPLANT
GOWN STRL REUS W/ TWL LRG LVL3 (GOWN DISPOSABLE) ×1 IMPLANT
GOWN STRL REUS W/ TWL XL LVL3 (GOWN DISPOSABLE) ×1 IMPLANT
GOWN STRL REUS W/TWL LRG LVL3 (GOWN DISPOSABLE) ×2
GOWN STRL REUS W/TWL XL LVL3 (GOWN DISPOSABLE) ×2
HEMOVAC 400ML (MISCELLANEOUS) ×3
K-WIRE ACE 1.6X6 (WIRE) ×6
KIT DRAIN HEMOVAC JP 7FR 400ML (MISCELLANEOUS) ×1 IMPLANT
KIT RM TURNOVER STRD PROC AR (KITS) ×3 IMPLANT
KWIRE ACE 1.6X6 (WIRE) ×2 IMPLANT
LABEL OR SOLS (LABEL) ×3 IMPLANT
NS IRRIG 1000ML POUR BTL (IV SOLUTION) ×3 IMPLANT
PACK EXTREMITY ARMC (MISCELLANEOUS) ×3 IMPLANT
PAD ABD DERMACEA PRESS 5X9 (GAUZE/BANDAGES/DRESSINGS) ×6 IMPLANT
PAD CAST CTTN 4X4 STRL (SOFTGOODS) ×2 IMPLANT
PAD PREP 24X41 OB/GYN DISP (PERSONAL CARE ITEMS) ×3 IMPLANT
PADDING CAST BLEND 6X4 STRL (MISCELLANEOUS) ×1 IMPLANT
PADDING CAST COTTON 4X4 STRL (SOFTGOODS) ×4
PADDING STRL CAST 6IN (MISCELLANEOUS) ×2
PLATE LOCK 7H 92 BILAT FIB (Plate) ×3 IMPLANT
SCREW ACE CAN 4.0 40M (Screw) ×6 IMPLANT
SCREW CORTICAL 3.5MM 18MM (Screw) ×6 IMPLANT
SCREW LOCK CORT STAR 3.5X10 (Screw) ×3 IMPLANT
SCREW LOCK CORT STAR 3.5X12 (Screw) ×3 IMPLANT
SCREW LOCK CORT STAR 3.5X14 (Screw) ×3 IMPLANT
SCREW NON LOCKING LP 3.5 14MM (Screw) ×3 IMPLANT
SCREW NON LOCKING LP 3.5 16MM (Screw) ×6 IMPLANT
SPLINT CAST 1 STEP 5X30 WHT (MISCELLANEOUS) ×6 IMPLANT
SPONGE LAP 18X18 5 PK (GAUZE/BANDAGES/DRESSINGS) ×3 IMPLANT
STAPLER SKIN PROX 35W (STAPLE) ×6 IMPLANT
STOCKINETTE IMPERV 14X48 (MISCELLANEOUS) ×3 IMPLANT
SUT VIC AB 0 CT1 36 (SUTURE) ×3 IMPLANT
SUT VIC AB 2-0 SH 27 (SUTURE) ×4
SUT VIC AB 2-0 SH 27XBRD (SUTURE) ×2 IMPLANT
SYR 10ML LL (SYRINGE) ×3 IMPLANT

## 2017-09-15 NOTE — Anesthesia Procedure Notes (Addendum)
Anesthesia Regional Block: Popliteal block   Pre-Anesthetic Checklist: ,, timeout performed, Correct Patient, Correct Site, Correct Laterality, Correct Procedure, Correct Position, site marked, Risks and benefits discussed,  Surgical consent,  Pre-op evaluation,  At surgeon's request and post-op pain management  Laterality: Right  Prep: chloraprep       Needles:  Injection technique: Single-shot  Needle Type: Stimiplex     Needle Length: 10cm  Needle Gauge: 20     Additional Needles:   Procedures:,,,, ultrasound used (permanent image in chart),,,,  Narrative:  Start time: 09/15/2017 4:09 PM End time: 09/15/2017 4:15 PM Injection made incrementally with aspirations every 5 mL.  Performed by: Personally  Anesthesiologist: Alver FisherPenwarden, Steve Gregg, MD  Additional Notes: Functioning IV was confirmed and monitors were applied.  A Stimuplex needle was used. Sterile prep and drape,hand hygiene and sterile gloves were used.  Negative aspiration and negative test dose prior to incremental administration of local anesthetic. The patient tolerated the procedure well.  Supplemental saphenous block also performed

## 2017-09-15 NOTE — Op Note (Signed)
09/15/2017  6:27 PM  Patient:   Kristina Crosby  Pre-Op Diagnosis:   Closed displaced bimalleolar fracture dislocation, right ankle.  Post-Op Diagnosis:   Same.  Procedure:   Open reduction and internal fixation of bimalleolar fracture dislocation, right ankle.  Surgeon:   Maryagnes Amos, MD  Assistant:   None  Anesthesia:   GET  Findings:   As above.  Complications:   None  EBL:   10 cc  Fluids:   900 cc crystalloid  UOP:   None  TT:   82 min at 250 mmHg  Drains:   None  Closure:   Staples  Implants:   Biomet ALPS 7-hole composite plate and screws  Brief Clinical Note:   The patient is a 27 year old female who injured her right ankle last evening while celebrating the new year. She was brought into the emergency room where x-rays demonstrated the above-noted injury. The fracture was reduced and splinted by the ER provider. The patient subsequently was admitted and presents at this time for definitive management of her injury.  Procedure:   The patient underwent placement of a popliteal block in the preoperative holding area before she was brought into the operating room and laid in the supine position. After adequate general endotracheal intubation and anesthesia was obtained, the right foot and lower leg were prepped with ChloraPrep solution, then draped sterilely. Preoperative antibiotics were administered. A timeout was performed to verify the appropriate surgical site before the limb was exsanguinated with an Esmarch and the calf tourniquet inflated to 250 mmHg. Laterally, an 8-10 cm incision was made over the lateral aspect of the distal fibula. The incision was carried down through the subcutaneous tissues to expose the fracture site. The fracture hematoma was debrided before the fracture was reduced and temporarily secured using a bone clamp. Two lag screws were placed in an anterior to posterior direction perpendicular to the fracture. A 7-hole composite plate was  carefully contoured before it was applied over the lateral aspect of the distal fibula. After verifying its position fluoroscopically, it was secured using a 3.5 mm nonlocking cortical screw proximal to the fracture. Again the plate's position was adjusted slightly based on AP and lateral projections before it was secured using two additional bicortical screws proximally and three locking screws distally. The adequacy of fracture reduction and hardware position was verified fluoroscopically in AP and lateral projections and found to be excellent.   Attention was directed to the medial side. An approximately 4 cm longitudinal incision was made over the medial and distal portions of the medial malleolus. This incision also was carried down through the subcutaneous tissues to expose the fracture site. Care was taken to identify and protect the saphenous nerve and vein. The fracture hematoma again was removed before the fracture was reduced. Two guidewires were placed obliquely across the fracture from distal to proximal into the distal tibial metaphysis. After verifying their positions fluoroscopically, each guidewire was sequentially over-reamed and replaced with a 40 mm partially threaded 4.0 cancellous screw in lag fashion. Again the adequacy of fracture reduction, hardware position, and mortise restoration was verified in AP, lateral, and oblique projections and found to be excellent. In addition, under fluoroscopic visualization, and external rotation force was applied to the ankle to assess the stability of the syndesmosis. It was deemed to be stable.  Each wound was copiously irrigated with sterile saline solution. Laterally, the subcutaneous tissues were closed in two layers using 2-0 Vicryl interrupted sutures before the skin  was closed using staples. Medially, the subcutaneous tissues were closed using 2-0 Vicryl interrupted sutures before the skin was closed using staples. A total of 20 cc of 0.5% plain  Sensorcaine was injected in and around the incision sites to help with postoperative analgesia. Sterile bulky dressings were applied to the wounds before the patient was placed into a posterior splint with a sugar tong supplement, maintaining the ankle in neutral dorsiflexion. The patient was then awakened, extubated, and returned to the recovery room in satisfactory condition after tolerating the procedure well.

## 2017-09-15 NOTE — ED Notes (Signed)
Pt placed on O2, fluids hanging, Insurance account managerN Charge Butch at bedside, EDT Allayne StackJann and MD Zenda AlpersWebster all assisting with splint

## 2017-09-15 NOTE — Anesthesia Preprocedure Evaluation (Signed)
Anesthesia Evaluation  Patient identified by MRN, date of birth, ID band Patient awake    Reviewed: Allergy & Precautions, NPO status , Patient's Chart, lab work & pertinent test results  History of Anesthesia Complications Negative for: history of anesthetic complications  Airway Mallampati: III  TM Distance: >3 FB Neck ROM: Full    Dental no notable dental hx.    Pulmonary neg sleep apnea, neg COPD, Current Smoker,    breath sounds clear to auscultation- rhonchi (-) wheezing      Cardiovascular Exercise Tolerance: Good (-) hypertension(-) CAD, (-) Past MI and (-) Cardiac Stents  Rhythm:Regular Rate:Normal - Systolic murmurs and - Diastolic murmurs    Neuro/Psych negative neurological ROS  negative psych ROS   GI/Hepatic Neg liver ROS, GERD  ,  Endo/Other  negative endocrine ROSneg diabetes  Renal/GU negative Renal ROS     Musculoskeletal negative musculoskeletal ROS (+)   Abdominal (+) + obese,   Peds  Hematology negative hematology ROS (+)   Anesthesia Other Findings Past Medical History: No date: GERD (gastroesophageal reflux disease) No date: Pulmonary embolus (HCC)   Reproductive/Obstetrics                             Anesthesia Physical Anesthesia Plan  ASA: II  Anesthesia Plan: General   Post-op Pain Management:  Regional for Post-op pain   Induction: Intravenous  PONV Risk Score and Plan: 1 and Ondansetron and Dexamethasone  Airway Management Planned: Oral ETT  Additional Equipment:   Intra-op Plan:   Post-operative Plan: Extubation in OR  Informed Consent: I have reviewed the patients History and Physical, chart, labs and discussed the procedure including the risks, benefits and alternatives for the proposed anesthesia with the patient or authorized representative who has indicated his/her understanding and acceptance.   Dental advisory given  Plan Discussed  with: CRNA and Anesthesiologist  Anesthesia Plan Comments:         Anesthesia Quick Evaluation

## 2017-09-15 NOTE — Anesthesia Postprocedure Evaluation (Signed)
Anesthesia Post Note  Patient: Kristina Crosby  Procedure(s) Performed: OPEN REDUCTION INTERNAL FIXATION (ORIF) ANKLE FRACTURE (Right Ankle)  Patient location during evaluation: PACU Anesthesia Type: General Level of consciousness: awake and alert and oriented Pain management: pain level controlled Vital Signs Assessment: post-procedure vital signs reviewed and stable Respiratory status: spontaneous breathing, nonlabored ventilation and respiratory function stable Cardiovascular status: blood pressure returned to baseline and stable Postop Assessment: no signs of nausea or vomiting Anesthetic complications: no     Last Vitals:  Vitals:   09/15/17 1843 09/15/17 1858  BP: (!) 146/94 (!) 147/82  Pulse: 96 77  Resp: (!) 22 17  Temp:  37.6 C  SpO2: 97% 97%    Last Pain:  Vitals:   09/15/17 1858  TempSrc:   PainSc: 0-No pain                 Muath Hallam

## 2017-09-15 NOTE — Anesthesia Procedure Notes (Signed)
Procedure Name: Intubation Date/Time: 09/15/2017 4:30 PM Performed by: Lowry Bowl, CRNA Pre-anesthesia Checklist: Patient identified, Emergency Drugs available, Suction available and Patient being monitored Patient Re-evaluated:Patient Re-evaluated prior to induction Oxygen Delivery Method: Circle system utilized Preoxygenation: Pre-oxygenation with 100% oxygen Induction Type: IV induction Ventilation: Mask ventilation without difficulty Laryngoscope Size: Mac and 3 Grade View: Grade I Tube type: Oral Tube size: 7.0 mm Number of attempts: 1 Airway Equipment and Method: Stylet Placement Confirmation: ETT inserted through vocal cords under direct vision,  positive ETCO2 and breath sounds checked- equal and bilateral Secured at: 22 cm Tube secured with: Tape Dental Injury: Teeth and Oropharynx as per pre-operative assessment

## 2017-09-15 NOTE — ED Provider Notes (Signed)
[facility] Emergency Department Provider Note   ____________________________________________   First MD Initiated Contact with Patient 09/15/17 0015     (approximate)  I have reviewed the triage vital signs and the nursing notes.   HISTORY  Chief Complaint Ankle Injury    HPI Kristina Crosby is a [age] y.o. female who comes into the hospital today with some right ankle pain.  The patient states that she fell on her leg wrong.  She is unsure exactly what happened but she states that she lost her balance and fell.  The patient states that she did not hit her head when she fell.  The patient has been drinking tonight.  She states that her pain is a 10 out of 10 in intensity.  She is having some ankle deformity and a lot of pain.  She came right here after she noticed her ankle was formed.  The patient does have a history of a PE but is not on any blood thinners.  She is here for evaluation.  Past Medical History:  Diagnosis Date  . GERD (gastroesophageal reflux disease)   . Pulmonary embolus [facility](HCC)     Patient Active Problem List   Diagnosis Date Noted  . Ankle fracture, bimalleolar, closed, right, initial encounter 09/15/2017    Past Surgical History:  Procedure Laterality Date  . CHOLECYSTECTOMY      Prior to Admission medications   Not on File    Allergies Patient has no known allergies.  Family History  Problem Relation Age of Onset  . Diabetes Father     Social History Social History   Tobacco Use  . Smoking status: Current Every Day Smoker    Types: Cigarettes  . Smokeless tobacco: Never Used  Substance Use Topics  . Alcohol use: Yes  . Drug use: No    Review of Systems  Constitutional: No fever/chills Eyes: No visual changes. ENT: No sore throat. Cardiovascular: Denies chest pain. Respiratory: Denies shortness of breath. Gastrointestinal: No abdominal pain.  No nausea, no vomiting.  No diarrhea.  No  constipation. Genitourinary: Negative for dysuria. Musculoskeletal: Right ankle pain and deformity Skin: Bruising to right ankle Neurological: Negative for headaches, focal weakness or numbness.   ____________________________________________   PHYSICAL EXAM:  VITAL SIGNS: ED Triage Vitals  Enc Vitals Group     BP 09/15/17 0020 (!) 145/93     Pulse Rate 09/15/17 0020 66     Resp 09/15/17 0020 (!) 24     Temp 09/15/17 0020 97.7 F (36.5 C)     Temp Source 09/15/17 0020 Oral     SpO2 09/15/17 0017 97 %     Weight 09/15/17 0021 264 lb (119.7 kg)     Height 09/15/17 0021 5\' 3"  (1.6 m)     Head Circumference --      Peak Flow --      Pain Score 09/15/17 0019 10     Pain Loc --      Pain Edu? --      Excl. in GC? --     Constitutional: Alert and oriented. Well appearing and in severe distress distress. Eyes: Conjunctivae are normal. PERRL. EOMI. Head: Atraumatic. Nose: No congestion/rhinnorhea. Mouth/Throat: Mucous membranes are moist.  Oropharynx non-erythematous. Cardiovascular: Normal rate, regular rhythm. Grossly normal heart sounds.  Good peripheral circulation. Respiratory: Normal respiratory effort.  No retractions. Lungs CTAB. Gastrointestinal: Soft and nontender. No distention.  Musculoskeletal: Deformity to the right ankle with some swelling and bruising, patient  has good sensation to her toes and is able to move her toes with some discomfort Neurologic:  Normal speech and language.  Skin:  Skin is warm, dry abrasion to right ankle.Marland Kitchen  Psychiatric: Mood and affect are normal.   ____________________________________________   LABS (all labs ordered are listed, but only abnormal results are displayed)  Labs Reviewed  ETHANOL - Abnormal; Notable for the following components:      Result Value   Alcohol, Ethyl (B) 207 (*)    All other components within normal limits  CBC - Abnormal; Notable for the following components:   WBC 13.6 (*)    All other components  within normal limits  HCG, QUANTITATIVE, PREGNANCY   ____________________________________________  EKG  none ____________________________________________  RADIOLOGY  Dg Ankle Complete Right  Result Date: 09/15/2017 CLINICAL DATA:  Status post reduction EXAM: RIGHT ANKLE - COMPLETE 3+ VIEW COMPARISON:  Films from earlier in the same day FINDINGS: Distal fibular and tibial fractures are again identified. The overall appearance is stable. Some widening of the tibiotalar joint is again seen. No new focal abnormality is noted. IMPRESSION: Distal tibial and fibular fractures are again noted. The overall appearance is stable from the prior exam. Electronically Signed   By: Alcide Clever M.D.   On: 09/15/2017 07:03   Dg Ankle Complete Right  Result Date: 09/15/2017 CLINICAL DATA:  Status post reduction of right ankle fracture. EXAM: RIGHT ANKLE - COMPLETE 3+ VIEW COMPARISON:  Right ankle radiographs performed earlier today at 12:13 a.m. FINDINGS: There is mildly improved alignment of the bimalleolar fracture, though there is persistent lateral displacement of the distal fibular and medial malleolar fracture fragments, and lateral talar tilt and subluxation. No new fractures are seen. The soft tissues are difficult to fully assess due to the surrounding splint. IMPRESSION: Mildly improved alignment of the bimalleolar fracture, though there is persistent lateral displacement of fracture fragments and lateral talar tilt and subluxation. Electronically Signed   By: Roanna Raider M.D.   On: 09/15/2017 05:28   Dg Ankle Complete Right  Result Date: 09/15/2017 CLINICAL DATA:  27 y/o  F; fall with ankle deformity. EXAM: RIGHT ANKLE - COMPLETE 3+ VIEW COMPARISON:  None. FINDINGS: Acute displaced and angulated oblique lower fibular fracture above level of tibial plafond. Acute displaced medial malleolus fracture. Lateral subluxation of ankle talar dome. Soft tissue swelling surrounding ankle joint. IMPRESSION: Acute  displaced bimalleolar fracture with lateral subluxation of talar dome. Electronically Signed   By: Mitzi Hansen M.D.   On: 09/15/2017 00:45    ____________________________________________   PROCEDURES  Procedure(s) performed: please, see procedure note(s).  .Sedation Date/Time: 09/15/2017 4:52 AM Performed by: Rebecka Apley, MD Authorized by: Rebecka Apley, MD   Consent:    Consent obtained:  Verbal and written   Consent given by:  Patient   Risks discussed:  Nausea, vomiting, respiratory compromise necessitating ventilatory assistance and intubation, allergic reaction, dysrhythmia, inadequate sedation and prolonged sedation necessitating reversal   Alternatives discussed:  Analgesia without sedation Universal protocol:    Procedure explained and questions answered to patient or proxy's satisfaction: yes     Relevant documents present and verified: yes     Test results available and properly labeled: yes     Imaging studies available: yes     Required blood products, implants, devices, and special equipment available: yes     Site/side marked: yes     Immediately prior to procedure a time out was called: yes  Patient identity confirmation method:  Arm band and verbally with patient Indications:    Procedure performed:  Fracture reduction   Procedure necessitating sedation performed by:  Physician performing sedation   Intended level of sedation:  Moderate (conscious sedation) Pre-sedation assessment:    Time since last food or drink:  0000   ASA classification: class 1 - normal, healthy patient     Neck mobility: normal     Mouth opening:  3 or more finger widths   Mallampati score:  I - soft palate, uvula, fauces, pillars visible   Pre-sedation assessments completed and reviewed: airway patency, cardiovascular function, mental status, nausea/vomiting, pain level and respiratory function     Pre-sedation assessment completed:  09/15/2017 3:30 AM Immediate  pre-procedure details:    Reviewed: vital signs and relevant labs/tests     Verified: bag valve mask available, intubation equipment available and suction available   Procedure details (see MAR for exact dosages):    Preoxygenation:  Nasal cannula   Sedation:  Etomidate   Analgesia:  Morphine   Intra-procedure monitoring:  Blood pressure monitoring and continuous pulse oximetry   Intra-procedure events: none     Total Provider sedation time (minutes):  5 Post-procedure details:    Post-sedation assessment completed:  09/15/2017 5:29 AM   Attendance: Constant attendance by certified staff until patient recovered     Recovery: Patient returned to pre-procedure baseline     Post-sedation assessments completed and reviewed: airway patency, cardiovascular function, mental status, nausea/vomiting, pain level, respiratory function and temperature     Patient is stable for discharge or admission: yes     Patient tolerance:  Tolerated well, no immediate complications Reduction of fracture Date/Time: 09/15/2017 4:52 AM Performed by: Rebecka Apley, MD Authorized by: Rebecka Apley, MD  Consent: Verbal consent obtained. Written consent obtained. Consent given by: patient Patient understanding: patient states understanding of the procedure being performed Patient consent: the patient's understanding of the procedure matches consent given Procedure consent: procedure consent matches procedure scheduled Relevant documents: relevant documents present and verified Test results: test results available and properly labeled Imaging studies: imaging studies available Required items: required blood products, implants, devices, and special equipment available Patient identity confirmed: verbally with patient Time out: Immediately prior to procedure a "time out" was called to verify the correct patient, procedure, equipment, support staff and site/side marked as required. Local anesthesia used:  no  Anesthesia: Local anesthesia used: no  Sedation: Patient sedated: yes Sedatives: etomidate Analgesia: morphine Sedation start date/time: 09/15/2017 4:52 AM Sedation end date/time: 09/15/2017 4:57 AM Vitals: Vital signs were monitored during sedation.  Patient tolerance: Patient tolerated the procedure well with no immediate complications  .Splint Application Date/Time: 09/15/2017 4:57 AM Performed by: Rebecka Apley, MD Authorized by: Rebecka Apley, MD   Consent:    Consent obtained:  Verbal   Consent given by:  Patient   Risks discussed:  Discoloration, numbness and pain Pre-procedure details:    Sensation:  Normal   Skin color:  Pink distally Procedure details:    Laterality:  Right   Location:  Ankle   Ankle:  R ankle   Cast type:  Short leg   Splint type:  Sugar tong and short leg   Supplies:  Ortho-Glass Post-procedure details:    Pain:  Unchanged   Sensation:  Normal   Skin color:  Pink distally   Patient tolerance of procedure:  Tolerated well, no immediate complications .Sedation Date/Time: 09/15/2017 6:06 AM Performed by: Zenda Alpers,  Melchor Amour, MD Authorized by: Rebecka Apley, MD   Consent:    Consent obtained:  Written (electronic informed consent)   Consent given by:  Patient   Risks discussed:  Allergic reaction, dysrhythmia, inadequate sedation, nausea, vomiting, respiratory compromise necessitating ventilatory assistance and intubation, prolonged sedation necessitating reversal and prolonged hypoxia resulting in organ damage Universal protocol:    Procedure explained and questions answered to patient or proxy's satisfaction: yes     Relevant documents present and verified: yes     Test results available and properly labeled: yes     Imaging studies available: yes     Required blood products, implants, devices, and special equipment available: yes     Immediately prior to procedure a time out was called: yes     Patient identity confirmation  method:  Arm band and verbally with patient Indications:    Procedure performed:  Fracture reduction   Procedure necessitating sedation performed by:  Physician performing sedation   Intended level of sedation:  Moderate (conscious sedation) Pre-sedation assessment:    Time since last food or drink:  0000   ASA classification: class 1 - normal, healthy patient     Neck mobility: normal     Mouth opening:  3 or more finger widths   Mallampati score:  I - soft palate, uvula, fauces, pillars visible   Pre-sedation assessments completed and reviewed: airway patency, cardiovascular function, nausea/vomiting, pain level and respiratory function     Pre-sedation assessment completed:  09/15/2017 3:30 AM Immediate pre-procedure details:    Reassessment: Patient reassessed immediately prior to procedure     Reviewed: vital signs, relevant labs/tests and NPO status     Verified: bag valve mask available, emergency equipment available, intubation equipment available, IV patency confirmed, oxygen available, reversal medications available and suction available   Procedure details (see MAR for exact dosages):    Preoxygenation:  Nasal cannula   Sedation:  Etomidate   Analgesia:  Morphine   Intra-procedure monitoring:  Blood pressure monitoring, continuous pulse oximetry, cardiac monitor, frequent vital sign checks and frequent LOC assessments   Intra-procedure events: none     Total Provider sedation time (minutes):  5 Post-procedure details:    Post-sedation assessment completed:  09/15/2017 7:26 AM   Attendance: Constant attendance by certified staff until patient recovered     Recovery: Patient returned to pre-procedure baseline     Post-sedation assessments completed and reviewed: airway patency, cardiovascular function, hydration status, mental status and respiratory function     Patient is stable for discharge or admission: yes     Patient tolerance:  Tolerated well, no immediate  complications Reduction of fracture Date/Time: 09/15/2017 6:08 AM Performed by: Rebecka Apley, MD Authorized by: Rebecka Apley, MD  Consent: Verbal consent obtained. Consent given by: patient Patient understanding: patient states understanding of the procedure being performed Patient consent: the patient's understanding of the procedure matches consent given Procedure consent: procedure consent matches procedure scheduled Relevant documents: relevant documents present and verified Test results: test results available and properly labeled Imaging studies: imaging studies available Required items: required blood products, implants, devices, and special equipment available Patient identity confirmed: verbally with patient and arm band Time out: Immediately prior to procedure a "time out" was called to verify the correct patient, procedure, equipment, support staff and site/side marked as required. Local anesthesia used: no  Anesthesia: Local anesthesia used: no  Sedation: Patient sedated: yes Sedation type: moderate (conscious) sedation Sedatives: etomidate Analgesia: morphine Sedation start  date/time: 09/15/2017 6:06 AM Sedation end date/time: 09/15/2017 6:11 AM Vitals: Vital signs were monitored during sedation.  Patient tolerance: Patient tolerated the procedure well with no immediate complications  .Splint Application Date/Time: 09/15/2017 6:10 AM Performed by: Rebecka Apley, MD Authorized by: Rebecka Apley, MD   Consent:    Consent obtained:  Verbal   Consent given by:  Patient   Risks discussed:  Numbness, pain, discoloration and swelling Pre-procedure details:    Sensation:  Normal   Skin color:  Pink distally Procedure details:    Laterality:  Right   Location:  Ankle   Ankle:  R ankle   Cast type:  Short leg   Splint type:  Short leg and sugar tong   Supplies:  Ortho-Glass Post-procedure details:    Pain:  Unchanged   Sensation:  Normal    Patient tolerance of procedure:  Tolerated well, no immediate complications    Critical Care performed: No  ____________________________________________   INITIAL IMPRESSION / ASSESSMENT AND PLAN / ED COURSE  As part of my medical decision making, I reviewed the following data within the electronic MEDICAL RECORD NUMBER Notes from prior ED visits and [place] Controlled Substance Database   This is a 27 year old female who comes into the hospital today after a fall.  The patient has some ankle pain and ankle deformity.  My differential diagnosis includes fracture versus sprain.  We sent the patient for an x-ray and the x-ray showed a bimalleolar displaced right ankle fracture.  The patient had some blood work drawn and her alcohol was 207.  We did give her some time to metabolize her alcohol and then we did a sedation to reduce the fracture.  The patient initially had some sedation with etomidate and I attempted to reduce the fracture.  The patient has some significant swelling and bruising but she was able to move her toes and still had good pulses.  The initial reduction was only minimally successful.  I contacted Dr. Joice Lofts the orthopedic surgeon and he requested that I attempt to further reduce the fracture.  He reports though that he will admit the patient for surgery.  I did sedate the patient began using etomidate again and he had more success with the second reduction.  The patient will be admitted to the orthopedic surgery service for further evaluation.      ____________________________________________   FINAL CLINICAL IMPRESSION(S) / ED DIAGNOSES  Final diagnoses:  Closed bimalleolar fracture of right ankle, initial encounter     ED Discharge Orders    None       Note:  This document was prepared using Dragon voice recognition software and may include unintentional dictation errors.    Rebecka Apley, MD 09/15/17 [phone]

## 2017-09-15 NOTE — ED Notes (Signed)
Called and RN's tied up in room at the moment. Try back for report after shift change unless RN gets to call back before shift change.

## 2017-09-15 NOTE — Care Management (Signed)
Placed in observation for ankle fracture. Two attempts to reduce by ED physician failed.  She is for surgical repair.
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
## 2017-09-15 NOTE — ED Notes (Signed)
Paper Consent signed, electronic consent not working

## 2017-09-15 NOTE — Sedation Documentation (Signed)
Clicked off DG left in error, order being modified by MD Zenda AlpersWebster

## 2017-09-15 NOTE — Transfer of Care (Signed)
Immediate Anesthesia Transfer of Care Note  Patient: Kristina Crosby  Procedure(s) Performed: OPEN REDUCTION INTERNAL FIXATION (ORIF) ANKLE FRACTURE (Right Ankle)  Patient Location: PACU  Anesthesia Type:GA combined with regional for post-op pain  Level of Consciousness: awake, oriented and patient cooperative  Airway & Oxygen Therapy: Patient Spontanous Breathing and Patient connected to face mask oxygen  Post-op Assessment: Report given to RN, Post -op Vital signs reviewed and stable and Patient moving all extremities  Post vital signs: Reviewed and stable  Last Vitals:  Vitals:   09/15/17 1611 09/15/17 1828  BP: 130/79   Pulse: 76   Resp: 18   Temp:  (P) 37.9 C  SpO2: 99%     Last Pain:  Vitals:   09/15/17 1828  TempSrc:   PainSc: (P) 0-No pain      Patients Stated Pain Goal: 5 (09/15/17 1511)  Complications: No apparent anesthesia complications

## 2017-09-15 NOTE — ED Notes (Signed)
Per Surgeon please re-reduction of ankle needed on patient. Pt informed and agreed to procedure. Equipment at bedside. EDT Mortimer FriesSarah, Charge Butch, and MD Zenda AlpersWebster at bedside

## 2017-09-15 NOTE — ED Notes (Signed)
X-ray at bedside

## 2017-09-15 NOTE — Progress Notes (Signed)
Subjective :Patient is day one admission for bimalleolar right ankle fracture Patient reports pain as marked.   no nausea and no vomiting Denies any chest pains or shortness of breath. No neurological deficit to the lower extremity according to patient    Objective: Vital signs in last 24 hours: Temp:  [97.7 F (36.5 C)-98.1 F (36.7 C)] 98.1 F (36.7 C) (01/01 0812) Pulse Rate:  [66-112] 94 (01/01 0812) Resp:  [11-24] 18 (01/01 0812) BP: (103-145)/(63-98) 125/70 (01/01 0812) SpO2:  [96 %-100 %] 99 % (01/01 0812) Weight:  [119.7 kg (264 lb)] 119.7 kg (264 lb) (01/01 0021) Right leg is elevated on 2 pillows. Able to move toes well. Sensation to light touch intact and within normal limits  Intake/Output from previous day: 12/31 0701 - 01/01 0700 In: -  Out: 300 [Urine:300] Intake/Output this shift: No intake/output data recorded.  Recent Labs    09/15/17 0105  HGB 14.1   Recent Labs    09/15/17 0105  WBC 13.6*  RBC 4.57  HCT 41.4  PLT 349   No results for input(s): NA, K, CL, CO2, BUN, CREATININE, GLUCOSE, CALCIUM in the last 72 hours. No results for input(s): LABPT, INR in the last 72 hours.  Neurologically intact Neurovascular intact Sensation intact distally Intact pulses distally Compartment soft  Assessment/Plan: Bimalleolar right ankle fracture Nothing by mouth Surgery pending today Case management to assist with discharge planning Plan to discharge tomorrow   Saladin Petrelli R. 09/15/2017, 8:53 AM

## 2017-09-15 NOTE — ED Triage Notes (Signed)
Pt presents via EMS after slip and fall down steps, R ankle deformity.

## 2017-09-15 NOTE — Anesthesia Post-op Follow-up Note (Signed)
Anesthesia QCDR form completed.        

## 2017-09-15 NOTE — ED Notes (Signed)
Attempted to call report. Receiving RN unable to take report and will call back when available.

## 2017-09-16 ENCOUNTER — Encounter: Payer: Self-pay | Admitting: Surgery

## 2017-09-16 MED ORDER — OXYCODONE HCL 5 MG PO TABS: 5.0000 mg | ORAL_TABLET | ORAL | 0 refills | Status: AC | PRN

## 2017-09-16 MED ORDER — APIXABAN 5 MG PO TABS: 5.0000 mg | ORAL_TABLET | Freq: Two times a day (BID) | ORAL | 0 refills | Status: DC

## 2017-09-16 MED ORDER — ENOXAPARIN SODIUM 40 MG/0.4ML ~~LOC~~ SOLN: 40.0000 mg | Freq: Two times a day (BID) | SUBCUTANEOUS | 1 refills | Status: AC

## 2017-09-16 NOTE — Progress Notes (Signed)
  Subjective: 1 Day Post-Op Procedure(s) (LRB): OPEN REDUCTION INTERNAL FIXATION (ORIF) ANKLE FRACTURE (Right) Patient reports pain as 6 on 0-10 scale.   Patient is well, and has had no acute complaints or problems Plan is to go Home after hospital stay. Negative for chest pain and shortness of breath Fever: no Gastrointestinal:Negative for nausea and vomiting, she states that she feels like she needs to have a BM.  Objective: Vital signs in last 24 hours: Temp:  [98.1 F (36.7 C)-100.2 F (37.9 C)] 98.3 F (36.8 C) (01/02 0756) Pulse Rate:  [58-112] 69 (01/02 0756) Resp:  [16-24] 18 (01/02 0756) BP: (111-147)/(62-94) 142/82 (01/02 0756) SpO2:  [95 %-99 %] 96 % (01/02 0756)  Intake/Output from previous day:  Intake/Output Summary (Last 24 hours) at 09/16/2017 0800 Last data filed at 09/16/2017 0513 Gross per 24 hour  Intake 2706.67 ml  Output 910 ml  Net 1796.67 ml    Intake/Output this shift: No intake/output data recorded.  Labs: Recent Labs    09/15/17 0105 09/15/17 1147  HGB 14.1 13.1   Recent Labs    09/15/17 0105 09/15/17 1147  WBC 13.6* 12.8*  RBC 4.57 4.21  HCT 41.4 38.2  PLT 349 359   Recent Labs    09/15/17 1147  NA 141  K 3.7  CL 112*  CO2 21*  BUN 9  CREATININE 0.68  GLUCOSE 90  CALCIUM 8.0*   No results for input(s): LABPT, INR in the last 72 hours.   EXAM General - Patient is Alert, Appropriate and Oriented Extremity - ABD soft Incision: dressing C/D/I No cellulitis present Dressing/Incision - clean, dry, no drainage, splint intact. Motor Function - intact, moving toes well on exam.  Intact to light touch to the right foot.  Cap refill intact to each toe.  Past Medical History:  Diagnosis Date  . GERD (gastroesophageal reflux disease)   . Pulmonary embolus (HCC)     Assessment/Plan: 1 Day Post-Op Procedure(s) (LRB): OPEN REDUCTION INTERNAL FIXATION (ORIF) ANKLE FRACTURE (Right) Active Problems:   Ankle fracture, bimalleolar,  closed, right, initial encounter  Estimated body mass index is 46.77 kg/m as calculated from the following:   Height as of this encounter: 5\' 3"  (1.6 m).   Weight as of this encounter: 119.7 kg (264 lb). Advance diet Up with therapy D/C IV fluids when tolerating po intake.  Labs from yesterday reviewed. Up with PT today. Plan will be for discharge home this afternoon. Pt does have a hx of a pulmonary embolism, will continue lovenox 40mg  BID when discharged home, will check with care management on cheapest medication for the patient as she does not have any insurance.  DVT Prophylaxis - Lovenox and TED hose Non-weightbearing to the right lower extremity.  Valeria BatmanJ. Lance McGhee, PA-C [facility] 09/16/2017, 8:00 AM

## 2017-09-16 NOTE — Progress Notes (Signed)
Discharge instructions and medication details reviewed with patient. Printed prescriptions for oxycodone and eliquis. Pt verbalizes understanding. And is aware of where to pick up Eliquis. IV removed. Patient was escorted out via wheelchair.

## 2017-09-16 NOTE — Discharge Instructions (Signed)
Diet: As you were doing prior to hospitalization   Shower:  May shower but keep the wounds dry, use an occlusive plastic wrap, NO SOAKING IN TUB.  If the bandage gets wet, change with a clean dry gauze.  Dressing:  You may change your dressing as needed. Change the dressing with sterile gauze dressing.    Activity:  Increase activity slowly as tolerated, but follow the weight bearing instructions below.  No lifting or driving for 6 weeks.  Weight Bearing:   Non-weightbearing to the right leg.  Blood Clot Prevention: Take Eliquis 5mg  twice daily for 4 weeks.  To prevent constipation: you may use a stool softener such as -  Colace (over the counter) 100 mg by mouth twice a day  Drink plenty of fluids (prune juice may be helpful) and high fiber foods Miralax (over the counter) for constipation as needed.    Itching:  If you experience itching with your medications, try taking only a single pain pill, or even half a pain pill at a time.  You may take up to 10 pain pills per day, and you can also use benadryl over the counter for itching or also to help with sleep.   Precautions:  If you experience chest pain or shortness of breath - call 911 immediately for transfer to the hospital emergency department!!  If you develop a fever greater that 101 F, purulent drainage from wound, increased redness or drainage from wound, or calf pain-Call [facility]                                              Follow- Up Appointment:  Please call for [facility]

## 2017-09-16 NOTE — Discharge Summary (Signed)
Physician Discharge Summary  Patient ID: Kristina Crosby MRN: [id number] DOB/AGE: 02-27-1991 26 y.o.  Admit date: 09/15/2017 Discharge date: 09/16/2017  Admission Diagnoses:  Pain [R52] Closed bimalleolar fracture of right ankle, initial encounter [S82.841A] Closed displaced bimalleolar fracture dislocation, right ankle.  Discharge Diagnoses: Patient Active Problem List   Diagnosis Date Noted  . Ankle fracture, bimalleolar, closed, right, initial encounter 09/15/2017  Closed displaced bimalleolar fracture dislocation, right ankle.  Past Medical History:  Diagnosis Date  . GERD (gastroesophageal reflux disease)   . Pulmonary embolus (HCC)      Transfusion: None.   Consultants (if any):   Discharged Condition: Improved  Hospital Course: Kristina Crosby is an 27 y.o. female who was admitted 09/15/2017 with a diagnosis of a closed displaced bimalleolar fracture dislocation of the right ankle and went to the operating room on 09/15/2017 and underwent the above named procedures.    Surgeries: Procedure(s): OPEN REDUCTION INTERNAL FIXATION (ORIF) ANKLE FRACTURE on 09/15/2017 Patient tolerated the surgery well. Taken to PACU where she was stabilized and then transferred to the orthopedic floor.  Started on Lovenox 40mg  q 12 hrs. Foot pumps applied bilaterally at 80 mm. Heels elevated on bed with rolled towels. No evidence of DVT. Negative Homan. Physical therapy started on day #1 for gait training and transfer. OT started day #1 for ADL and assisted devices.  Patient's IV was removed on POD1.  Implants: Biomet ALPS 7-hole composite plate and screws  She was given perioperative antibiotics:  Anti-infectives (From admission, onward)   Start     Dose/Rate Route Frequency Ordered Stop   09/15/17 2000  ceFAZolin (ANCEF) 2 g in dextrose 5 % 100 mL IVPB     2 g 240 mL/hr over 30 Minutes Intravenous Every 6 hours 09/15/17 1938 09/16/17 0916   09/15/17 1500  ceFAZolin (ANCEF) IVPB 2g/100 mL  premix  Status:  Discontinued     2 g 200 mL/hr over 30 Minutes Intravenous  Once 09/15/17 0553 09/15/17 0555   09/15/17 0600  ceFAZolin (ANCEF) 2 g in dextrose 5 % 50 mL IVPB     2 g 100 mL/hr over 30 Minutes Intravenous  Once 09/15/17 0555 09/15/17 1642    .  She was given sequential compression devices, early ambulation, and Lovenox for DVT prophylaxis.  She benefited maximally from the hospital stay and there were no complications.    Recent vital signs:  Vitals:   09/16/17 0430 09/16/17 0756  BP: 130/76 (!) 142/82  Pulse: (!) 58 69  Resp: 16 18  Temp: 98.3 F (36.8 C) 98.3 F (36.8 C)  SpO2: 97% 96%    Recent laboratory studies:  Lab Results  Component Value Date   HGB 13.1 09/15/2017   HGB 14.1 09/15/2017   HGB 14.3 01/08/2017   Lab Results  Component Value Date   WBC 12.8 (H) 09/15/2017   PLT 359 09/15/2017   No results found for: INR Lab Results  Component Value Date   NA 141 09/15/2017   K 3.7 09/15/2017   CL 112 (H) 09/15/2017   CO2 21 (L) 09/15/2017   BUN 9 09/15/2017   CREATININE 0.68 09/15/2017   GLUCOSE 90 09/15/2017    Discharge Medications:   Allergies as of 09/16/2017   No Known Allergies     Medication List    TAKE these medications   apixaban 5 MG Tabs tablet Commonly known as:  ELIQUIS Take 1 tablet (5 mg total) by mouth 2 (two) times daily.   enoxaparin 40  MG/0.4ML injection Commonly known as:  LOVENOX Inject 0.4 mLs (40 mg total) into the skin every 12 (twelve) hours.   oxyCODONE 5 MG immediate release tablet Commonly known as:  Oxy IR/ROXICODONE Take 1-2 tablets (5-10 mg total) by mouth every 4 (four) hours as needed for moderate pain.            Durable Medical Equipment  (From admission, onward)        Start     Ordered   09/15/17 1939  DME 3 n 1  Once     09/15/17 1938      Diagnostic Studies: Dg Ankle 2 Views Right  Result Date: 09/15/2017 CLINICAL DATA:  Right ankle ORIF. EXAM: RIGHT ANKLE - 2 VIEW; DG  C-ARM 61-120 MIN COMPARISON:  Right ankle x-rays from same date. FINDINGS: Intraoperative x-rays demonstrate interval lateral plate and screw fixation of the distal fibular fracture, and two screw fixation of the medial malleolus. Alignment is anatomic. The ankle mortise is symmetric. IMPRESSION: Interval medial malleolar and distal fibular ORIF without evidence of hardware complication. Alignment is anatomic. FLUOROSCOPY TIME:  19 seconds. C-arm fluoroscopic images were obtained intraoperatively and submitted for post operative interpretation. Electronically Signed   By: Obie DredgeWilliam T Derry M.D.   On: 09/15/2017 18:08   Dg Ankle Complete Right  Result Date: 09/15/2017 CLINICAL DATA:  Status post reduction EXAM: RIGHT ANKLE - COMPLETE 3+ VIEW COMPARISON:  Films from earlier in the same day FINDINGS: Distal fibular and tibial fractures are again identified. The overall appearance is stable. Some widening of the tibiotalar joint is again seen. No new focal abnormality is noted. IMPRESSION: Distal tibial and fibular fractures are again noted. The overall appearance is stable from the prior exam. Electronically Signed   By: Alcide CleverMark  Lukens M.D.   On: 09/15/2017 07:03   Dg Ankle Complete Right  Result Date: 09/15/2017 CLINICAL DATA:  Status post reduction of right ankle fracture. EXAM: RIGHT ANKLE - COMPLETE 3+ VIEW COMPARISON:  Right ankle radiographs performed earlier today at 12:13 a.m. FINDINGS: There is mildly improved alignment of the bimalleolar fracture, though there is persistent lateral displacement of the distal fibular and medial malleolar fracture fragments, and lateral talar tilt and subluxation. No new fractures are seen. The soft tissues are difficult to fully assess due to the surrounding splint. IMPRESSION: Mildly improved alignment of the bimalleolar fracture, though there is persistent lateral displacement of fracture fragments and lateral talar tilt and subluxation. Electronically Signed   By: Roanna RaiderJeffery   Chang M.D.   On: 09/15/2017 05:28   Dg Ankle Complete Right  Result Date: 09/15/2017 CLINICAL DATA:  27 y/o  F; fall with ankle deformity. EXAM: RIGHT ANKLE - COMPLETE 3+ VIEW COMPARISON:  None. FINDINGS: Acute displaced and angulated oblique lower fibular fracture above level of tibial plafond. Acute displaced medial malleolus fracture. Lateral subluxation of ankle talar dome. Soft tissue swelling surrounding ankle joint. IMPRESSION: Acute displaced bimalleolar fracture with lateral subluxation of talar dome. Electronically Signed   By: Mitzi HansenLance  Furusawa-Stratton M.D.   On: 09/15/2017 00:45   Dg C-arm 1-60 Min  Result Date: 09/15/2017 CLINICAL DATA:  Right ankle ORIF. EXAM: RIGHT ANKLE - 2 VIEW; DG C-ARM 61-120 MIN COMPARISON:  Right ankle x-rays from same date. FINDINGS: Intraoperative x-rays demonstrate interval lateral plate and screw fixation of the distal fibular fracture, and two screw fixation of the medial malleolus. Alignment is anatomic. The ankle mortise is symmetric. IMPRESSION: Interval medial malleolar and distal fibular ORIF without evidence of hardware  complication. Alignment is anatomic. FLUOROSCOPY TIME:  19 seconds. C-arm fluoroscopic images were obtained intraoperatively and submitted for post operative interpretation. Electronically Signed   By: Obie Dredge M.D.   On: 09/15/2017 18:08   Disposition: Plan will be for discharge home this afternoon on Eliquis.  Follow-up Information    Anson [place], PA-C Follow up in 10 day(s).   Specialty:  Physician Assistant Why:  Mindi Slicker information: [address] [place] [address] [phone]          Signed: Meriel Pica PA-C 09/16/2017, 12:00 PM
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
## 2017-09-16 NOTE — Care Management Note (Addendum)
Case Management Note  Patient Details  Name: Seleta Rhymesriscilla Sproles MRN: [id number] Date of Birth: 05/19/1991  Subjective/Objective: Case discussed with Dr. Joice LoftsPoggi. Eliquis covered by medication management clinic and I called and verfied they have it. Application given to patient for medication management/[facility]. Email sent to both agencies. Patient will need a walker. Ordered from DrummondJason with [facility]. Eliquis prescription faxed over to [facility]ication [facility].                  Information regarding [facility] given by PT.   Action/Plan:   Expected Discharge Date:  09/17/17               Expected Discharge Plan:  Home/Self Care  In-House Referral:     Discharge planning Services  CM Consult, Indigent Health Clinic, Medication Assistance  Post Acute Care Choice:  Durable Medical Equipment Choice offered to:  Patient  DME Arranged:  Walker rolling DME Agency:  [facility]  [facility]:     Status of Service:  Completed, signed off  If discussed at Long Length of Stay Meetings, dates discussed:    Additional Comments:  Marily MemosLisa M Candace Ramus, RN 09/16/2017, 11:16 AM

## 2017-09-16 NOTE — Plan of Care (Signed)
  Education: Knowledge of General Education information will improve 09/16/2017 0513 - Progressing by Song Garris, Genelle GatherMatthew Scott, RN

## 2017-09-22 ENCOUNTER — Telehealth: Payer: Self-pay

## 2017-09-22 NOTE — Telephone Encounter (Deleted)
[facility] - New Patient Interview  Patient Information:  Seleta Rhymesriscilla Pensinger [address] [place] [phone] (home)   1. Have you ever been seen at the clinic before? No.  2. What do you need to be seen for? ***  Note: We do not see patients for Physicals, Disability/ Medicaid Determinations, Mental Health [phone]), TB/STD Testing (Health Dept [phone]), Mammograms/[facility] [phone]), Pregnancy (Health Dept [phone]), Pain of any kind (Ref to [facility] [phone] and [facility] [phone])  3.  [place] you [place]? No.   Note: If No, they are not eligible for the clinic.  4. Do you have insurance (Medicare, [place], [place], or any other form of insurance -  this includes Medicare Part A only) ? No.  Note: If Yes, they are not eligible for the clinic.  5. How do you support yourself? Are you working? {yes no:[id number]}  Note: Their monthly income (from a job, unemployment, Tree surgeonsocial security, disability, child support, worker's compensation, etc.) cannot exceed $1,450. If they are slightly over this amount, please see Jolinda CroakLori, Tracy, or [facility] for approval.   -$ __12___ per hour X _40__ hours per week =  _____ -Total: _480____ X 4.5 = 2,160_____   Note: If they have no income, they are eligible for the clinic with a notarized letter from the support person. If they cannot provide a support person, they will not be eligible for the clinic.    If the person is eligible for the clinic according to the information above, please go over the documentation they will need to bring in before they will be scheduled for an appointment.    Patient is over the income limit and was referred to [facility] sliding scale clinics.

## 2017-09-29 NOTE — Telephone Encounter (Signed)
Error

## 2017-10-21 ENCOUNTER — Other Ambulatory Visit: Payer: Self-pay

## 2017-10-21 ENCOUNTER — Encounter: Payer: Self-pay | Admitting: Emergency Medicine

## 2017-10-21 DIAGNOSIS — I82491 Acute embolism and thrombosis of other specified deep vein of right lower extremity: Secondary | ICD-10-CM | POA: Insufficient documentation

## 2017-10-21 DIAGNOSIS — Z86711 Personal history of pulmonary embolism: Secondary | ICD-10-CM | POA: Insufficient documentation

## 2017-10-21 DIAGNOSIS — X58XXXD Exposure to other specified factors, subsequent encounter: Secondary | ICD-10-CM | POA: Insufficient documentation

## 2017-10-21 DIAGNOSIS — F17210 Nicotine dependence, cigarettes, uncomplicated: Secondary | ICD-10-CM | POA: Insufficient documentation

## 2017-10-21 DIAGNOSIS — S99911D Unspecified injury of right ankle, subsequent encounter: Secondary | ICD-10-CM | POA: Insufficient documentation

## 2017-10-21 NOTE — ED Triage Notes (Signed)
Patient to ER for c/o right lower leg pain. Had fractured ankle in early January, had surgery to repair (1/1 or 09/16/17). Patient arrives with hard cast in place that is due to be taken off next Wednesday. Patient reports intermittently worsening cramping pain to right calf. Denies any sudden intense pain. States foot also has burning sensation.

## 2017-10-22 ENCOUNTER — Emergency Department: Payer: Self-pay

## 2017-10-22 ENCOUNTER — Emergency Department
Admission: EM | Admit: 2017-10-22 | Discharge: 2017-10-23 | Disposition: A | Discharge status: Home / Self Care | Payer: Self-pay | Attending: Emergency Medicine | Admitting: Emergency Medicine

## 2017-10-22 DIAGNOSIS — I82459 Acute embolism and thrombosis of unspecified peroneal vein: Secondary | ICD-10-CM

## 2017-10-22 LAB — FIBRIN DERIVATIVES D-DIMER (ARMC ONLY): Fibrin derivatives D-dimer (ARMC): 720.37 ng/mL (FEU) — ABNORMAL HIGH (ref 0.00–499.00)

## 2017-10-22 MED ORDER — APIXABAN 5 MG PO TABS: ORAL_TABLET | ORAL | 2 refills | Status: AC

## 2017-10-22 MED ORDER — OXYCODONE-ACETAMINOPHEN 5-325 MG PO TABS
2.0000 | ORAL_TABLET | Freq: Once | ORAL | Status: AC
Start: 2017-10-22 — End: 2017-10-22
  Administered 2017-10-22: 2 via ORAL
  Filled 2017-10-22: qty 2

## 2017-10-22 MED ORDER — OXYCODONE-ACETAMINOPHEN 5-325 MG PO TABS
1.0000 | ORAL_TABLET | ORAL | Status: DC | PRN
  Administered 2017-10-22: 1 via ORAL
  Filled 2017-10-22: qty 1

## 2017-10-22 MED ORDER — ONDANSETRON 4 MG PO TBDP
4.0000 mg | ORAL_TABLET | Freq: Once | ORAL | Status: AC | PRN
  Administered 2017-10-22: 4 mg via ORAL
  Filled 2017-10-22: qty 1

## 2017-10-22 MED ORDER — OXYCODONE-ACETAMINOPHEN 5-325 MG PO TABS: 1.0000 | ORAL_TABLET | Freq: Four times a day (QID) | ORAL | 0 refills | Status: AC | PRN

## 2017-10-22 NOTE — ED Provider Notes (Signed)
POST SPLINT CHECK Right lower leg splint applied by tech.  Good position.  Distally N/V intact, sensation intact. No discoloration.    Phineas SemenGoodman, Arvle Grabe, MD 10/22/17 [phone]

## 2017-10-22 NOTE — ED Notes (Signed)
Posterior short leg splint applied by this RN, Marisue IvanLiz, RN and Clydie BraunKaren, Charity fundraiserN. CMS intact after application of splint.

## 2017-10-22 NOTE — ED Provider Notes (Signed)
[facility] Emergency Department Provider Note  ____________________________________________   First MD Initiated Contact with Patient 10/22/17 0413     (approximate)  I have reviewed the triage vital signs and the nursing notes.   HISTORY  Chief Complaint Leg Pain    HPI Kristina Crosby is a 27 y.o. female whose past medical history includes a severe orthopedic injury of her right ankle about a month ago status post surgery and who is still in a cast as well as a history of pulmonary embolism.  She presents for evaluation of about 2 days of gradually worsening pain in her right calf radiating throughout her leg with some numbness and tingling in her foot.  She also reports a feeling of swelling in the calf.  The pain is severe at this point and nothing is making it better nor worse.  Her orthopedic surgeon is Dr. Joice Lofts and Dr. Marney Doctor.  She is scheduled to have her cast removed next week.  Eliquis but stopped taking it about a week ago.  She developed her symptoms within the last 2-3 days.  She denies shortness of breath, chest pain, fever/chills, nausea, vomiting, and abdominal pain.  Past Medical History:  Diagnosis Date  . GERD (gastroesophageal reflux disease)   . Pulmonary embolus [facility])     Patient Active Problem List   Diagnosis Date Noted  . Ankle fracture, bimalleolar, closed, right, initial encounter 09/15/2017    Past Surgical History:  Procedure Laterality Date  . CHOLECYSTECTOMY    . ORIF ANKLE FRACTURE Right 09/15/2017   Procedure: OPEN REDUCTION INTERNAL FIXATION (ORIF) ANKLE FRACTURE;  Surgeon: Christena Flake, MD;  Location: [facility] ORS;  Service: Orthopedics;  Laterality: Right;    Prior to Admission medications   Medication Sig Start Date End Date Taking? Authorizing Provider  apixaban (ELIQUIS) 5 MG TABS tablet Take 2 tablets (10 mg) PO BID x 7 days, then reduce dose to 1 tablet (5 mg) PO BID. 10/22/17   Loleta Rose, MD  enoxaparin  (LOVENOX) 40 MG/0.4ML injection Inject 0.4 mLs (40 mg total) into the skin every 12 (twelve) hours. 09/16/17   Anson Oregon, PA-C  oxyCODONE (OXY IR/ROXICODONE) 5 MG immediate release tablet Take 1-2 tablets (5-10 mg total) by mouth every 4 (four) hours as needed for moderate pain. 09/16/17   Anson Oregon, PA-C  oxyCODONE-acetaminophen (PERCOCET) 5-325 MG tablet Take 1-2 tablets by mouth every 6 (six) hours as needed for severe pain. 10/22/17   Loleta Rose, MD    Allergies Patient has no known allergies.  Family History  Problem Relation Age of Onset  . Diabetes Father     Social History Social History   Tobacco Use  . Smoking status: Current Every Day Smoker    Types: Cigarettes  . Smokeless tobacco: Never Used  Substance Use Topics  . Alcohol use: Yes  . Drug use: No    Review of Systems Constitutional: No fever/chills Eyes: No visual changes. ENT: No sore throat. Cardiovascular: Denies chest pain. Respiratory: Denies shortness of breath. Gastrointestinal: No abdominal pain.  No nausea, no vomiting.  No diarrhea.  No constipation. Genitourinary: Negative for dysuria. Musculoskeletal: Pain, swelling, and tingling in right lower extremity from the calf down as described above Integumentary: Negative for rash. Neurological: Negative for headaches, focal weakness or numbness.   ____________________________________________   PHYSICAL EXAM:  VITAL SIGNS: ED Triage Vitals  Enc Vitals Group     BP 10/21/17 2352 124/78     Pulse  Rate 10/21/17 2352 80     Resp 10/21/17 2352 20     Temp 10/21/17 2352 98.9 F (37.2 C)     Temp Source 10/21/17 2352 Oral     SpO2 10/21/17 2352 99 %     Weight 10/21/17 2353 113.4 kg (250 lb)     Height 10/21/17 2353 1.575 m (5\' 2" )     Head Circumference --      Peak Flow --      Pain Score 10/21/17 2352 8     Pain Loc --      Pain Edu? --      Excl. in GC? --     Constitutional: Alert and oriented.  Crying quietly, but  does not appear to be in severe distress Eyes: Conjunctivae are normal.  Head: Atraumatic. Neck: No stridor.  No meningeal signs.   Cardiovascular: Normal rate, regular rhythm. Good peripheral circulation. Grossly normal heart sounds. Respiratory: Normal respiratory effort.  No retractions. Lungs CTAB. Gastrointestinal: Soft and nontender. No distention.  Musculoskeletal: Short leg right-sided cast is in place which I removed as described below.  There is some minimal swelling but the operative sites appear well with no evidence of infection and no gross deformities.  Compartments are soft and easily compressible Neurologic:  Normal speech and language. No gross focal neurologic deficits are appreciated.  Skin:  Skin is warm, dry and intact. No rash noted.  Well Appearing surgical scars Psychiatric: Mood and affect are normal. Speech and behavior are normal.  ____________________________________________   LABS (all labs ordered are listed, but only abnormal results are displayed)  Labs Reviewed  FIBRIN DERIVATIVES D-DIMER ([facility] ONLY) - Abnormal; Notable for the following components:      Result Value   Fibrin derivatives D-dimer [facility]) 720.37 (*)    All other components within normal limits   ____________________________________________  EKG  None - EKG not ordered by ED physician ____________________________________________  RADIOLOGY   ED MD interpretation: Deep calf vein thrombosis of the peroneal vein with minimal clot burden  Official radiology report(s): US Venous Img Lower Unilateral Right  Result Date: 10/22/2017 CLINICAL DATA:  Acute onset of right lower extremity pain, swelling and tingling. EXAM: RIGHT LOWER EXTREMITY VENOUS DOPPLER ULTRASOUND TECHNIQUE: Gray-scale sonography with graded compression, as well as color Doppler and duplex ultrasound were performed to evaluate the lower extremity deep venous systems from the level of the common femoral vein and including  the common femoral, femoral, profunda femoral, popliteal and calf veins including the posterior tibial, peroneal and gastrocnemius veins when visible. The superficial great saphenous vein was also interrogated. Spectral Doppler was utilized to evaluate flow at rest and with distal augmentation maneuvers in the common femoral, femoral and popliteal veins. COMPARISON:  None. FINDINGS: Contralateral Common Femoral Vein: Respiratory phasicity is normal and symmetric with the symptomatic side. No evidence of thrombus. Normal compressibility. Common Femoral Vein: No evidence of thrombus. Normal compressibility, respiratory phasicity and response to augmentation. Saphenofemoral Junction: No evidence of thrombus. Normal compressibility and flow on color Doppler imaging. Profunda Femoral Vein: No evidence of thrombus. Normal compressibility and flow on color Doppler imaging. Femoral Vein: No evidence of thrombus. Normal compressibility, respiratory phasicity and response to augmentation. Popliteal Vein: No evidence of thrombus. Normal compressibility, respiratory phasicity and response to augmentation. Calf Veins: There is suspicion of occlusive thrombus within the right peroneal veins, though visualization of the peroneal veins is limited. The posterior tibial vein remains patent. Superficial Great Saphenous Vein: No evidence of thrombus.  Normal compressibility. Venous Reflux:  None. Other Findings:  None. IMPRESSION: Suspect occlusive deep venous thrombosis within the right peroneal veins. These results were called by telephone at the time of interpretation on 10/22/2017 at 6:32 am to Dr. Loleta RoseORY Sharetha Newson, who verbally acknowledged these results. Electronically Signed   By: Roanna RaiderJeffery  Chang M.D.   On: 10/22/2017 06:34    ____________________________________________   PROCEDURES  Critical Care performed: No   Procedure(s) performed:   Procedures   ____________________________________________   INITIAL IMPRESSION  / ASSESSMENT AND PLAN / ED COURSE  As part of my medical decision making, I reviewed the following data within the electronic MEDICAL RECORD NUMBER Nursing notes reviewed and incorporated, Labs reviewed , Discussed with radiologist and Notes from prior ED visits    Differential diagnosis includes, but is not limited to, DVT, compartment syndrome, postsurgical infection including cellulitis, necrotizing fasciitis, etc., peripheral edema, neuropathic pain/injury, etc.  DVT is most likely for this patient with a prior history of pulmonary embolism and who stopped her anticoagulation about a week ago after an orthopedic surgery and immobilization.  Given her symptoms we need to remove cast to rule out compartment syndrome and to evaluate for any signs of infection as well as to evaluate with a Doppler ultrasound.  The patient understands that this is necessary.  Percocet for pain and ultrasound and cast removal, that we will determine if any additional testing is needed.  D-dimer obtained in triage is elevated, further concerning for d-dimer.  Clinical Course as of Oct 22 806  Thu Oct 22, 2017  0451 Erie NoeVanessa (RN) and I cut off the cast using a Stryker saw.  Patient had some pain and anxiety during the cast removal, particularly when splitting it apart at the foot, but the pain subsided after we removed the cast.  Compartments are soft and easily compressible and nontender.  There is no evidence of any infection around the surgical sites and no sign of cellulitis.  The skin is discolored after a month in a cast but there is no indication of acute infection.  The patient's foot is currently elevated and we will proceed with ultrasound  [CF]  0709 I reviewed the patient's prescription history over the last 24 months in the multi-state controlled substances database(s) that includes [place], [place], [place], [place], [place], [place], [place], [place], [place], [place], [place], [place], and [place].  Results were notable for multiple prescriptions for narcotics, but these were almost all prescribed by orthopedic surgeons after her recent injury.  She has no active prescriptions at this time.  I will provide her with a prescription for Percocet until she can follow-up, but hopefully she will not need any additional narcotics after the acute DVT is treated  [CF]  0800 Awaiting splint placement by ED staff.  Signed out to Dr. Derrill KayGoodman to reassess N/V status after splint placement.  [CF]    Clinical Course User Index [CF] Loleta RoseForbach, Janeth Terry, MD    ____________________________________________  FINAL CLINICAL IMPRESSION(S) / ED DIAGNOSES  Final diagnoses:  Acute deep vein thrombosis (DVT) of peroneal vein (HCC)     MEDICATIONS GIVEN DURING THIS VISIT:  Medications  ondansetron (ZOFRAN-ODT) disintegrating tablet 4 mg (4 mg Oral Given 10/22/17 0024)  oxyCODONE-acetaminophen (PERCOCET/ROXICET) 5-325 MG per tablet 2 tablet (2 tablets Oral Given 10/22/17 0452)     ED Discharge Orders        Ordered    apixaban (ELIQUIS) 5 MG TABS tablet  10/22/17 0711    oxyCODONE-acetaminophen (PERCOCET) 5-325 MG tablet  Every 6 hours PRN     10/22/17 4098       Note:  This document was prepared using Dragon voice recognition software and may include unintentional dictation errors.    Loleta Rose, MD 10/22/17 [phone]

## 2017-10-22 NOTE — Discharge Instructions (Addendum)
You have been seen in the Emergency Department (ED) today and diagnosed with a deep vein thrombosis (DVT), which is a blood clot in one of your veins.  As we discussed, we are starting you on a blood thinner.  Though these carry with them the risk of bleeding, the risks of not treating your DVT (stroke, a clot in your lungs, etc.) are greater than the risk of taking the medication. ° °Please follow up with the doctor listed in these papers as recommended regarding today's ED visit.     ° °Return to the ED if you have worsening pain, ANY trouble breathing, chest pain, or other new or worsening symptoms that concern you. ° °

## 2017-10-22 NOTE — ED Notes (Addendum)
Discussed lab result with patient and need for further evaluation of leg to rule out DVT. Awaiting room for MD eval. No other needs at this time. Patient declined blankets to prop leg on for comfort. Dr. Manson PasseyBrown aware of results. Patient to be further evaluated upon arrival to an exam room.

## 2017-10-22 NOTE — ED Notes (Signed)
Purple and green top tube sent to be saved when blue top drawn for blood.

## 2017-10-22 NOTE — ED Notes (Signed)
NAD noted at time of D/C. Pt denies questions or concerns. Pt taken to the lobby at this time.

## 2017-10-22 NOTE — ED Notes (Signed)
This RN to bedside to introduce self to patient/family. Pt resting in bed with R leg elevated. Pt c/o pain to leg 7/10 at this time. Explained delay to patient. Patient and family states understanding at this time.

## 2017-10-22 NOTE — ED Notes (Signed)
Right half cast removed via MD, York CeriseForbach and RN, Erie NoeVanessa.

## 2017-10-26 ENCOUNTER — Inpatient Hospital Stay: Payer: Self-pay | Attending: Hematology and Oncology | Admitting: Hematology and Oncology

## 2018-12-07 IMAGING — DX DG ANKLE COMPLETE 3+V*R*
3 series · 3 of 3 positions shown · non-contrast
Comparison: Films from earlier in the same day

CLINICAL DATA: Status post reduction

EXAM:
RIGHT ANKLE - COMPLETE 3+ VIEW

[ankle ap]
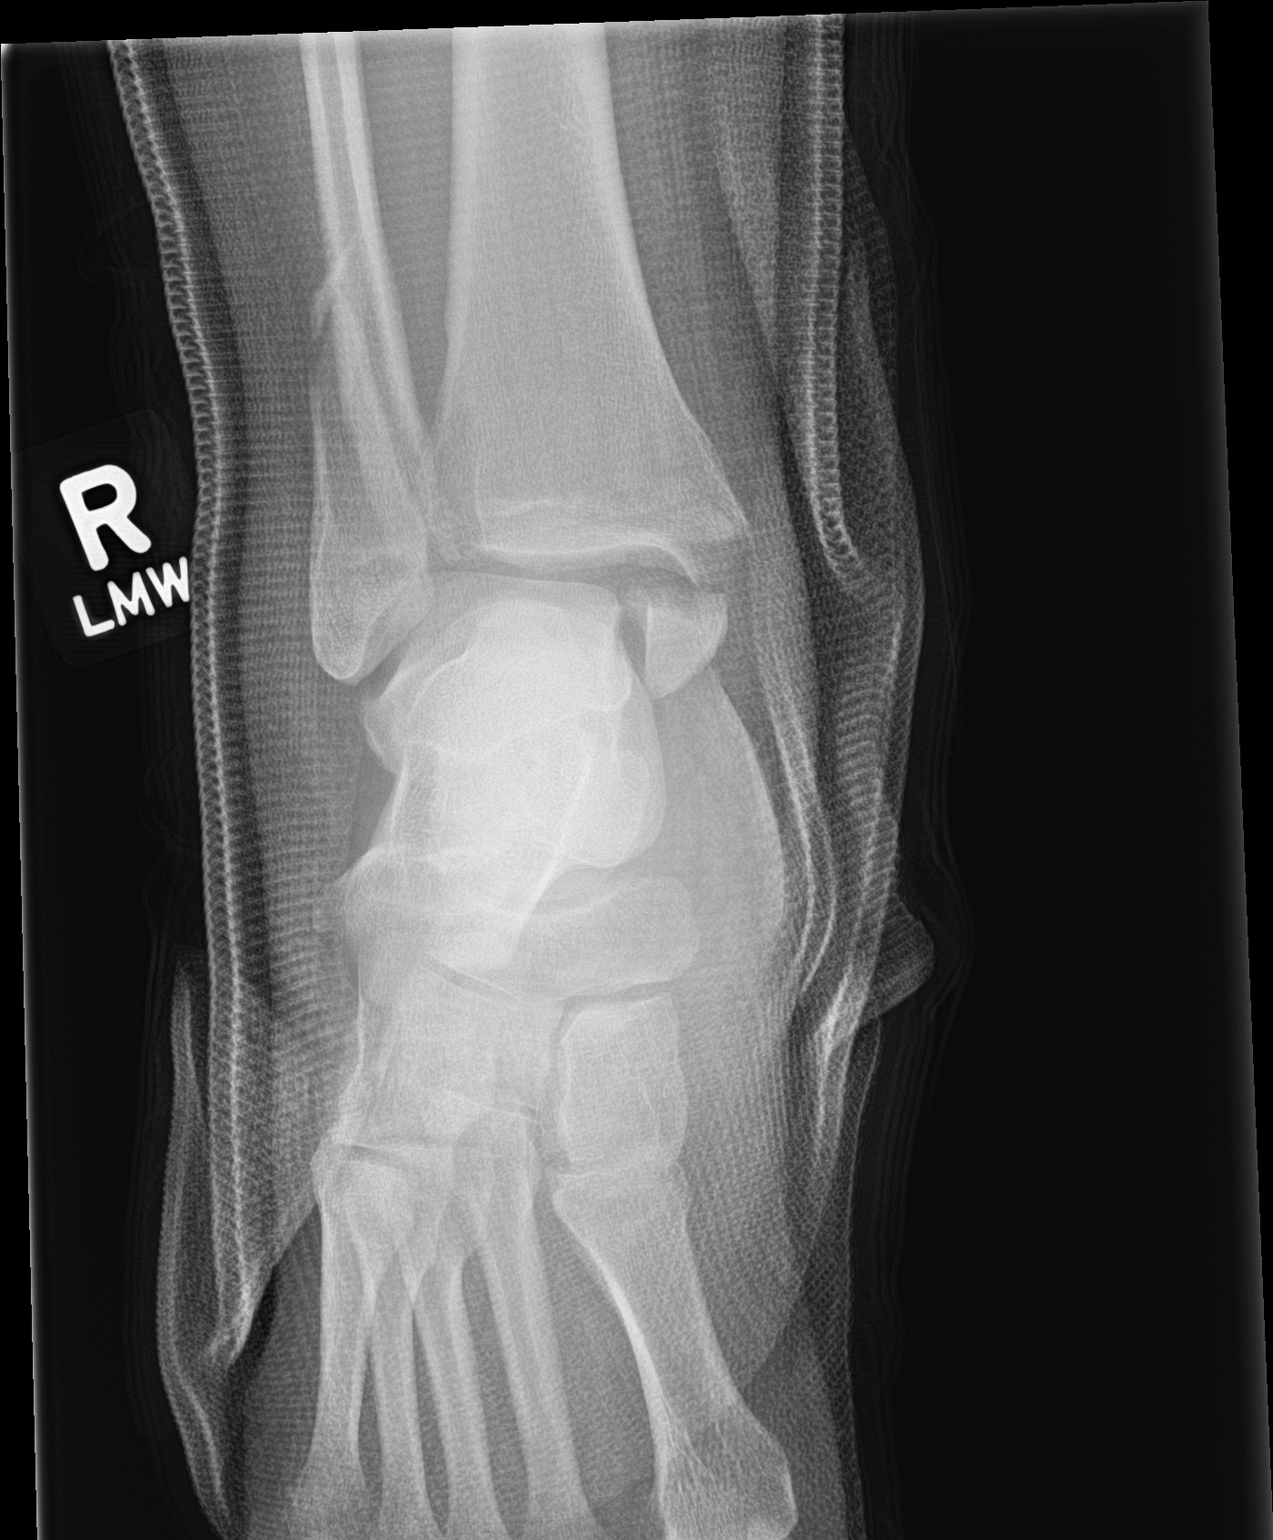

[ankle obl]
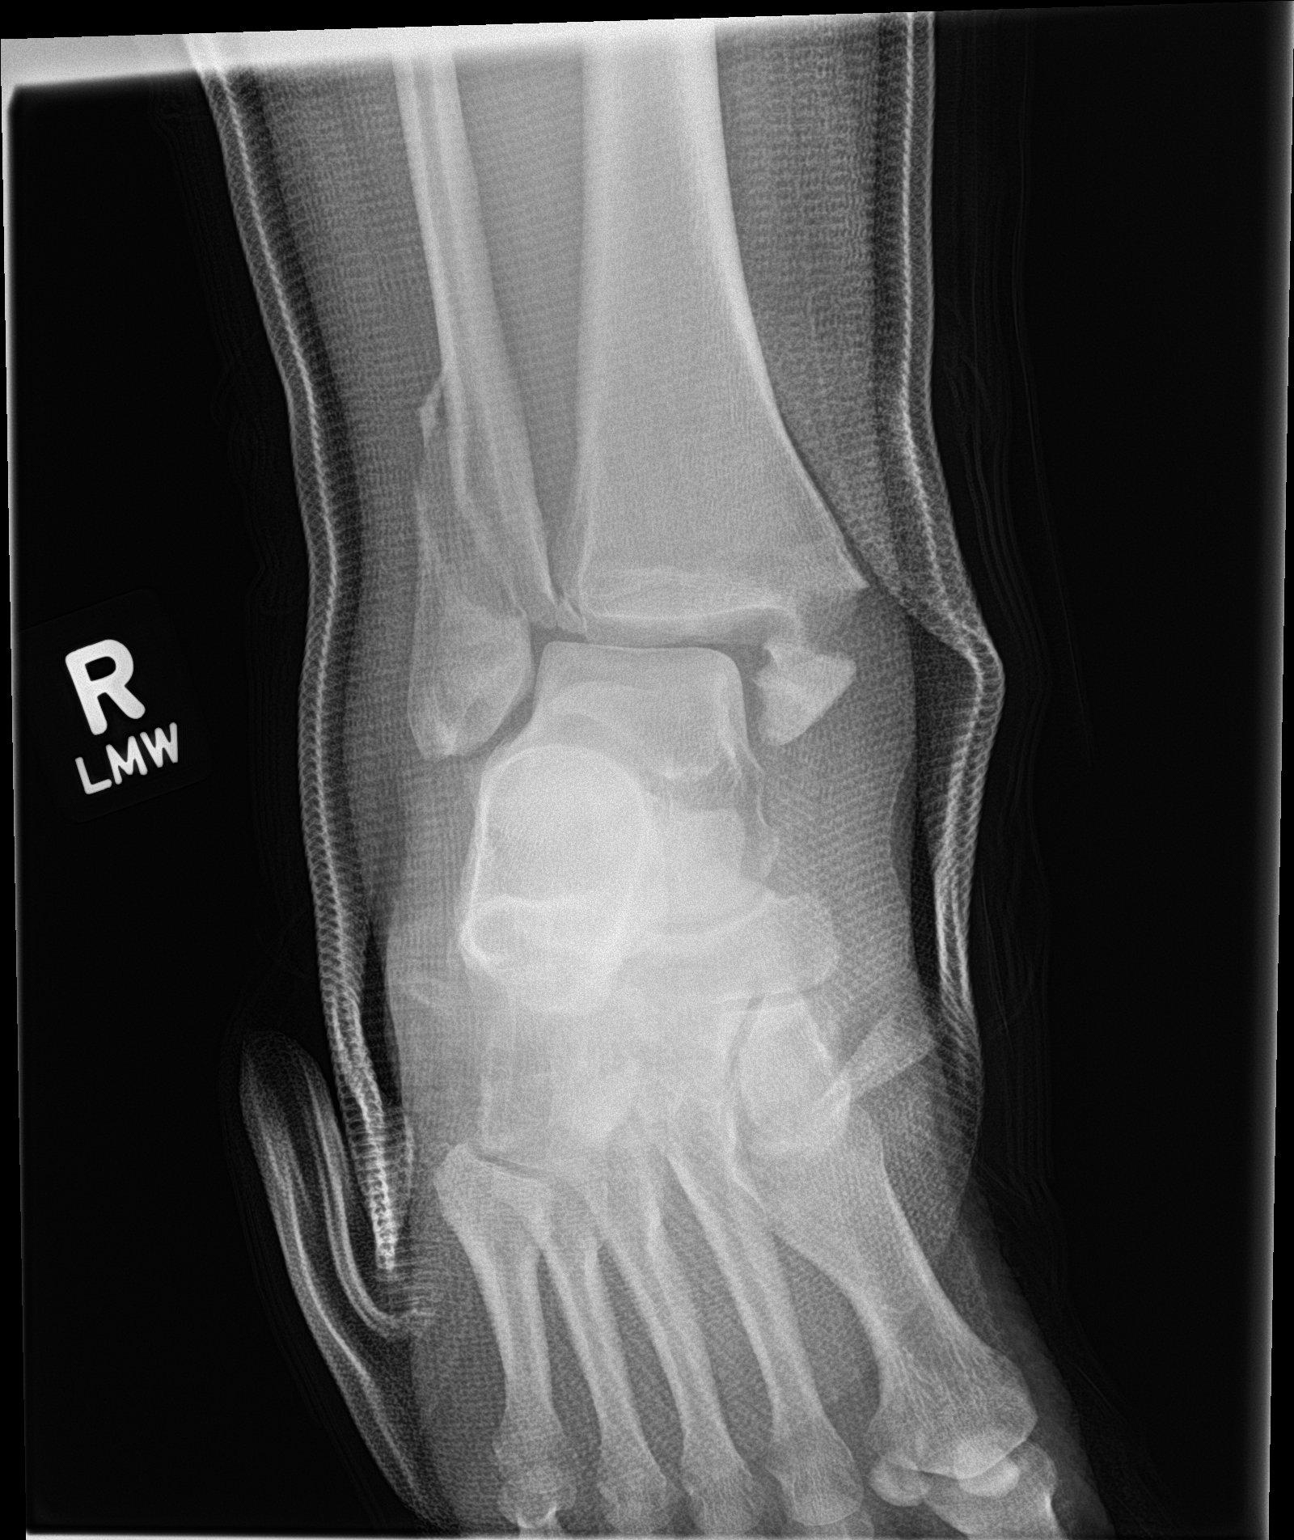

[ankle lat]
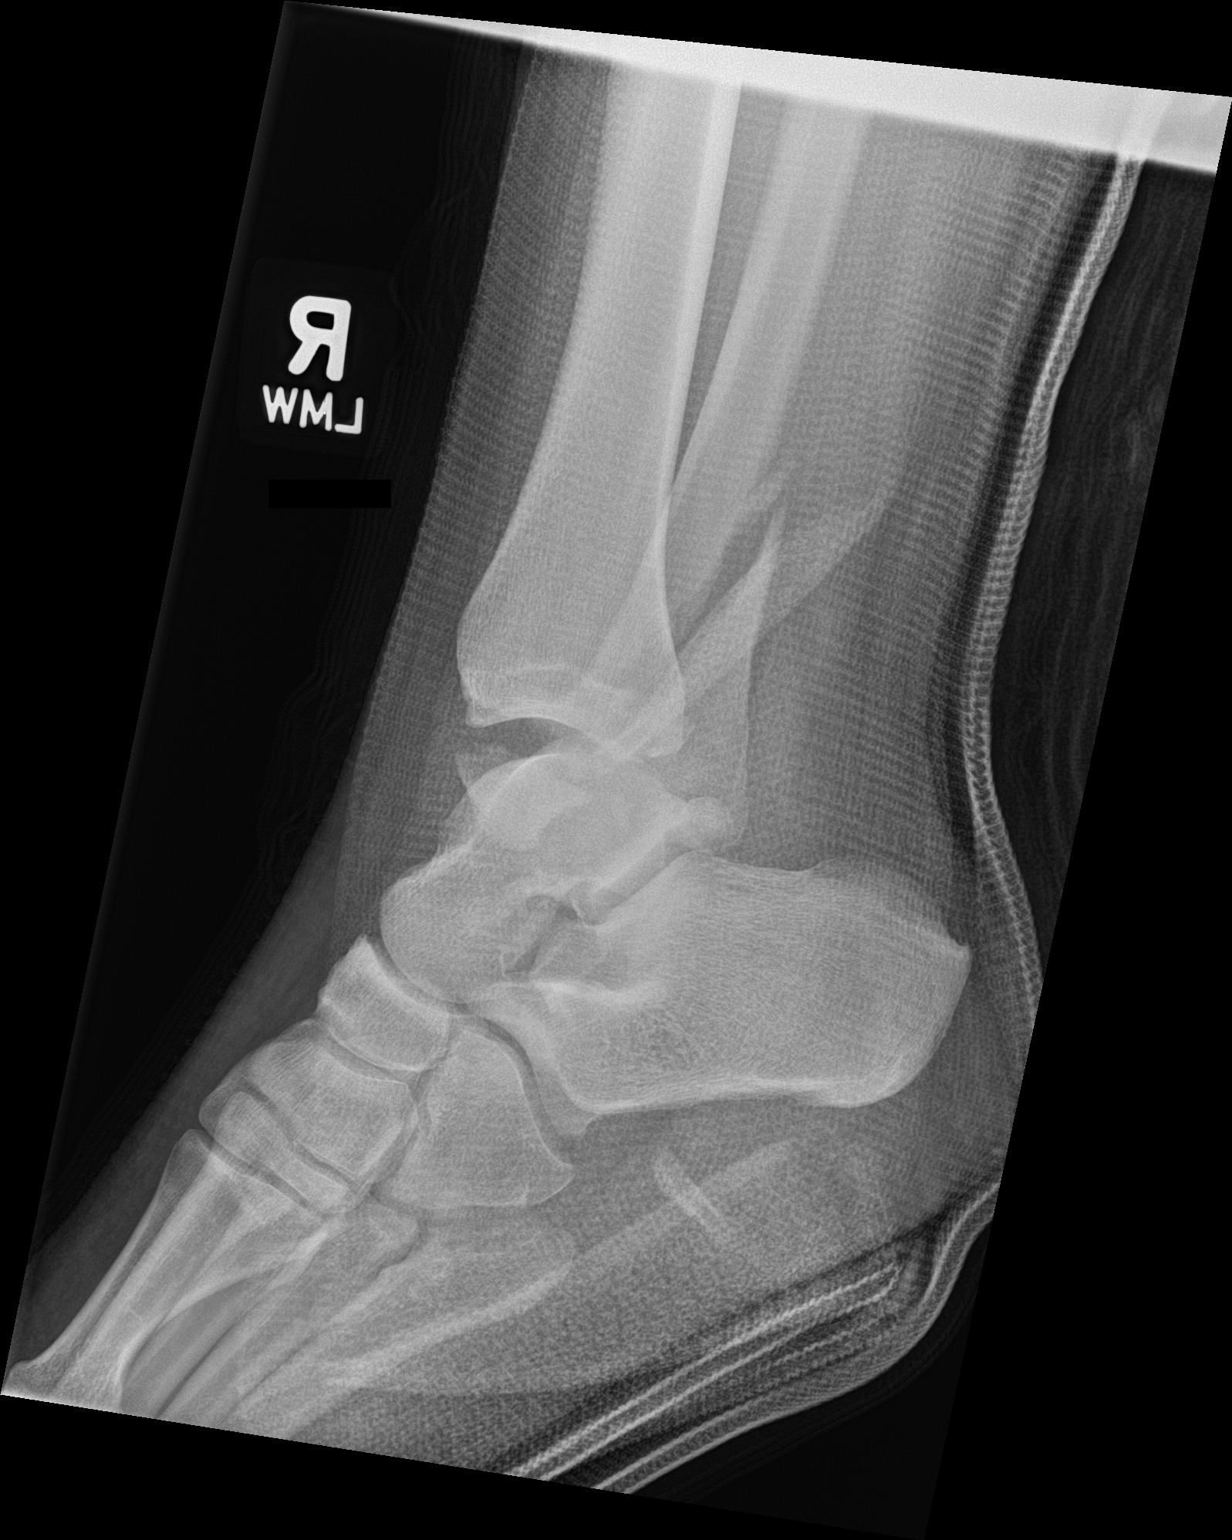

[3 of 3 positions shown; findings below may reference images not displayed]

FINDINGS: Distal fibular and tibial fractures are again identified. The
overall appearance is stable. Some widening of the tibiotalar joint
is again seen. No new focal abnormality is noted.
IMPRESSION: Distal tibial and fibular fractures are again noted. The overall
appearance is stable from the prior exam.

## 2018-12-07 IMAGING — DX DG ANKLE COMPLETE 3+V*R*
3 series · 3 of 3 positions shown · non-contrast
Comparison: Right ankle radiographs performed earlier today at
[DATE] a.m.

CLINICAL DATA: Status post reduction of right ankle fracture.

EXAM:
RIGHT ANKLE - COMPLETE 3+ VIEW

[ankle lat]
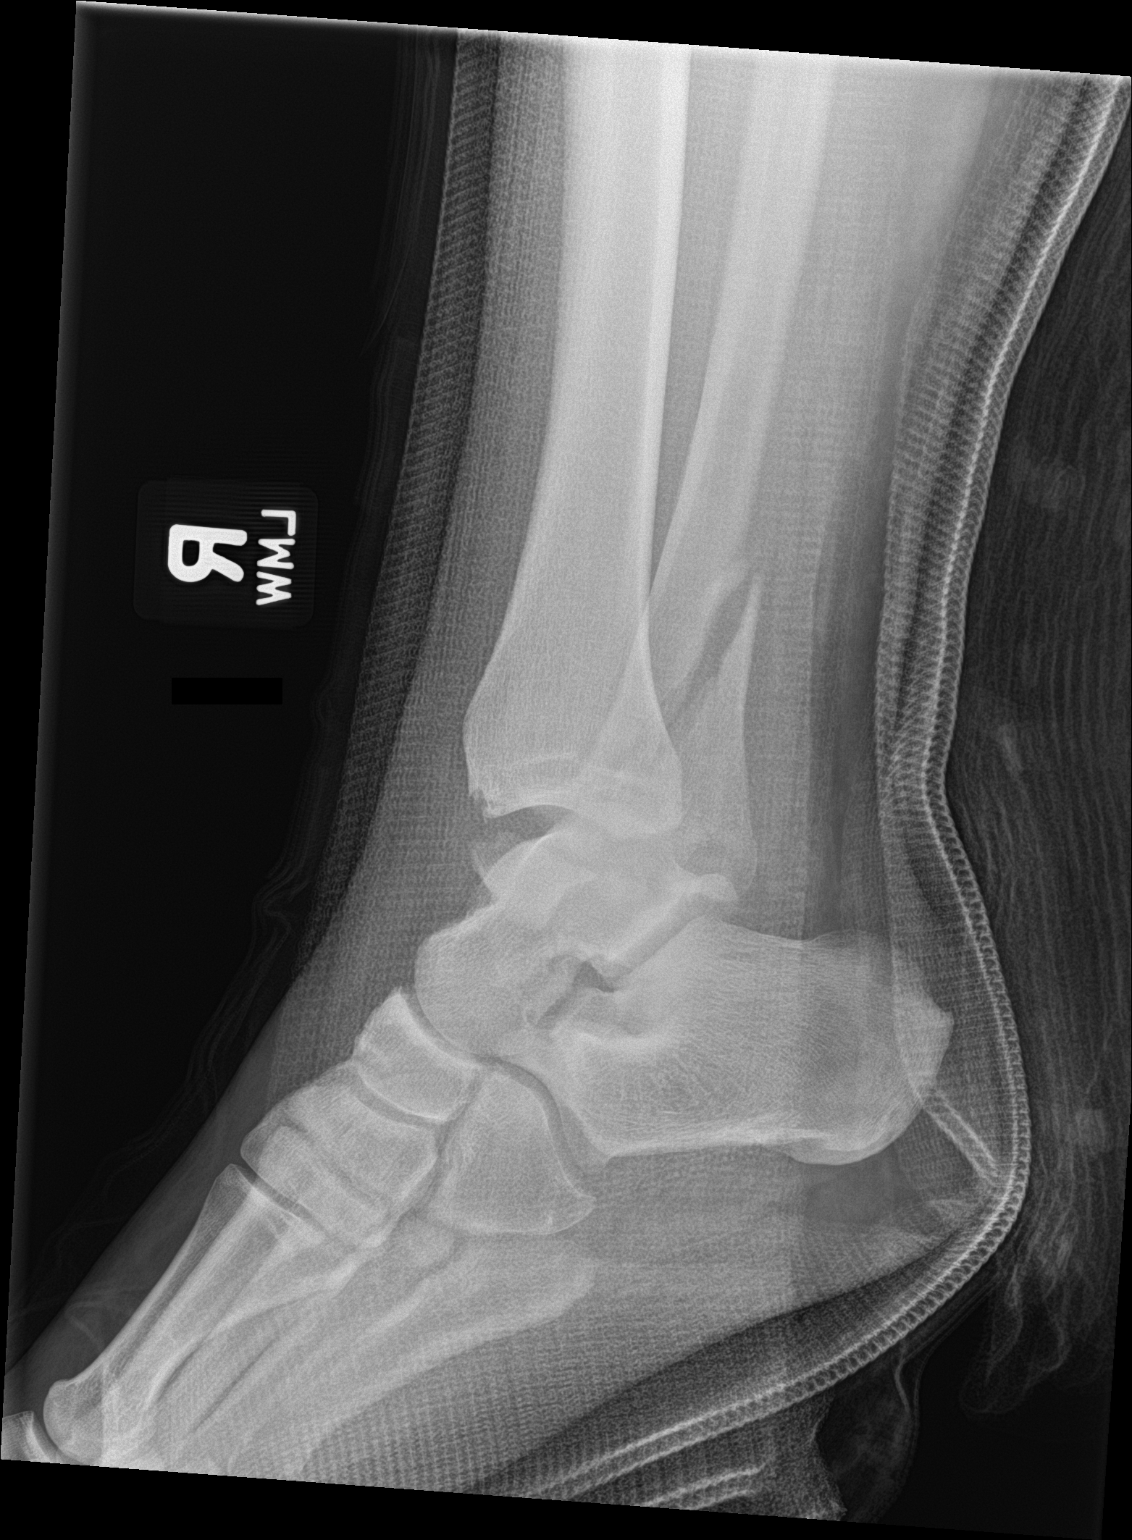

[ankle obl]
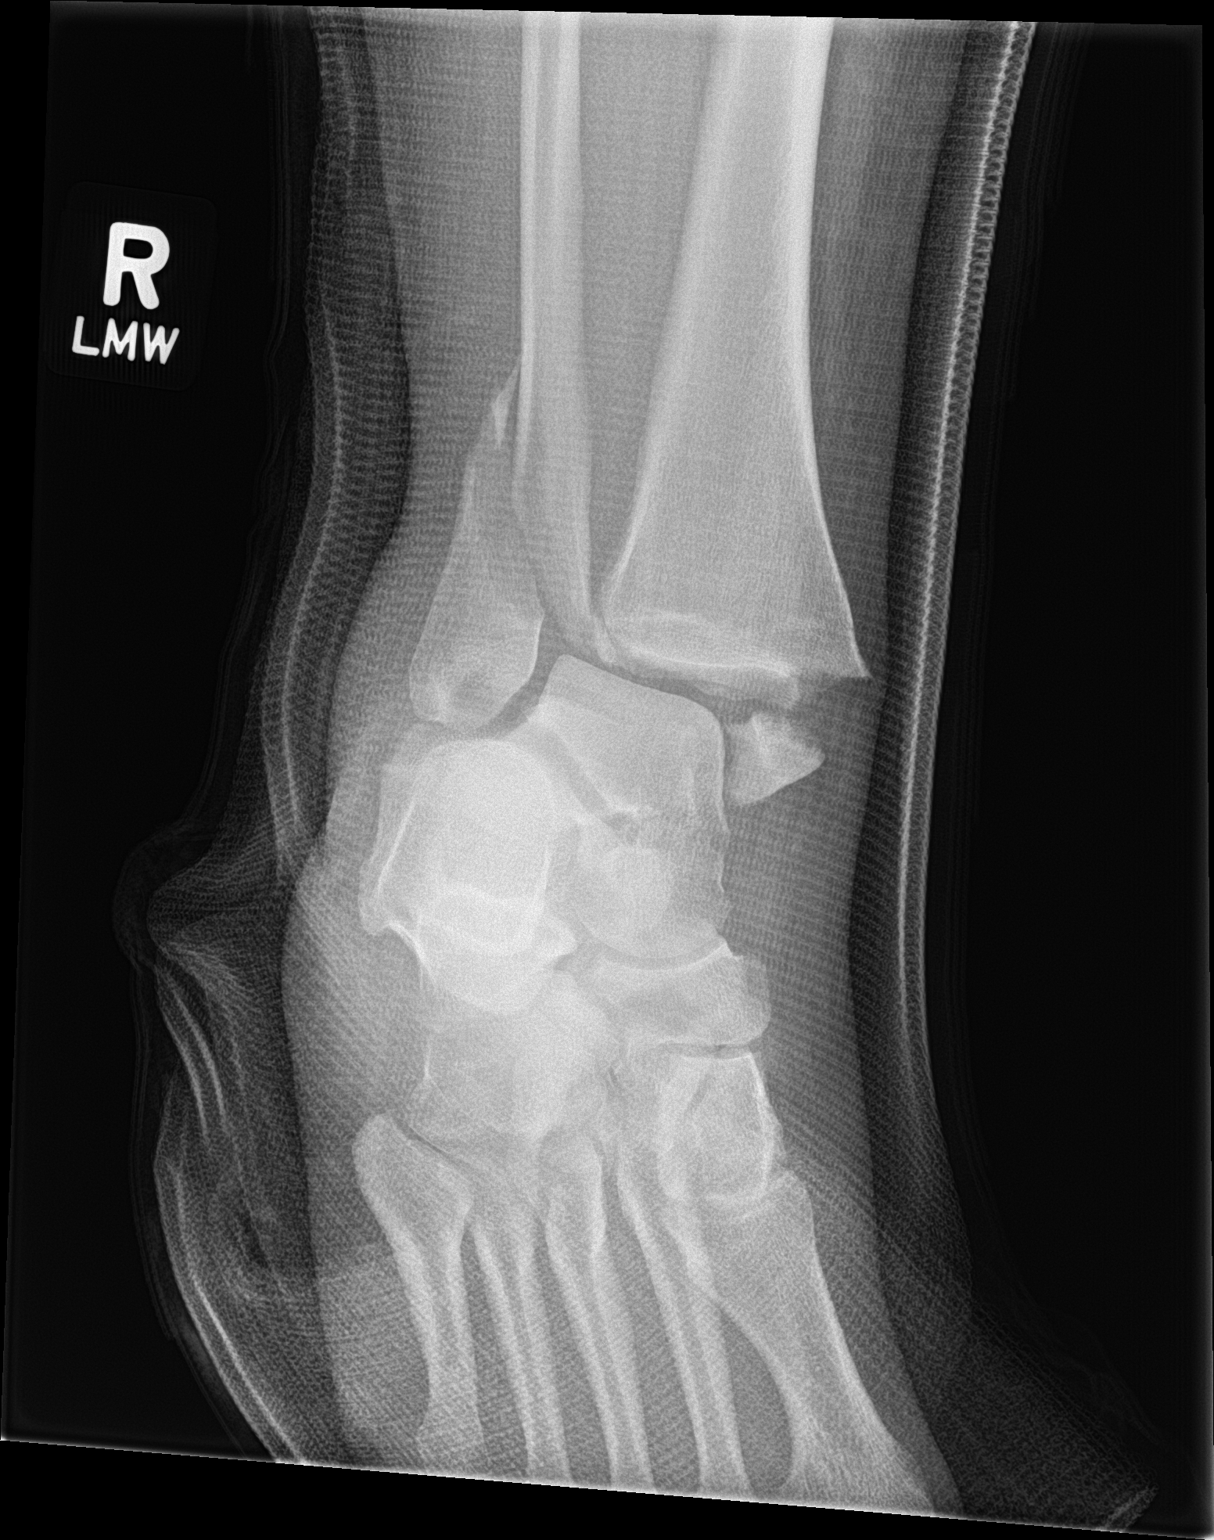

[ankle ap]
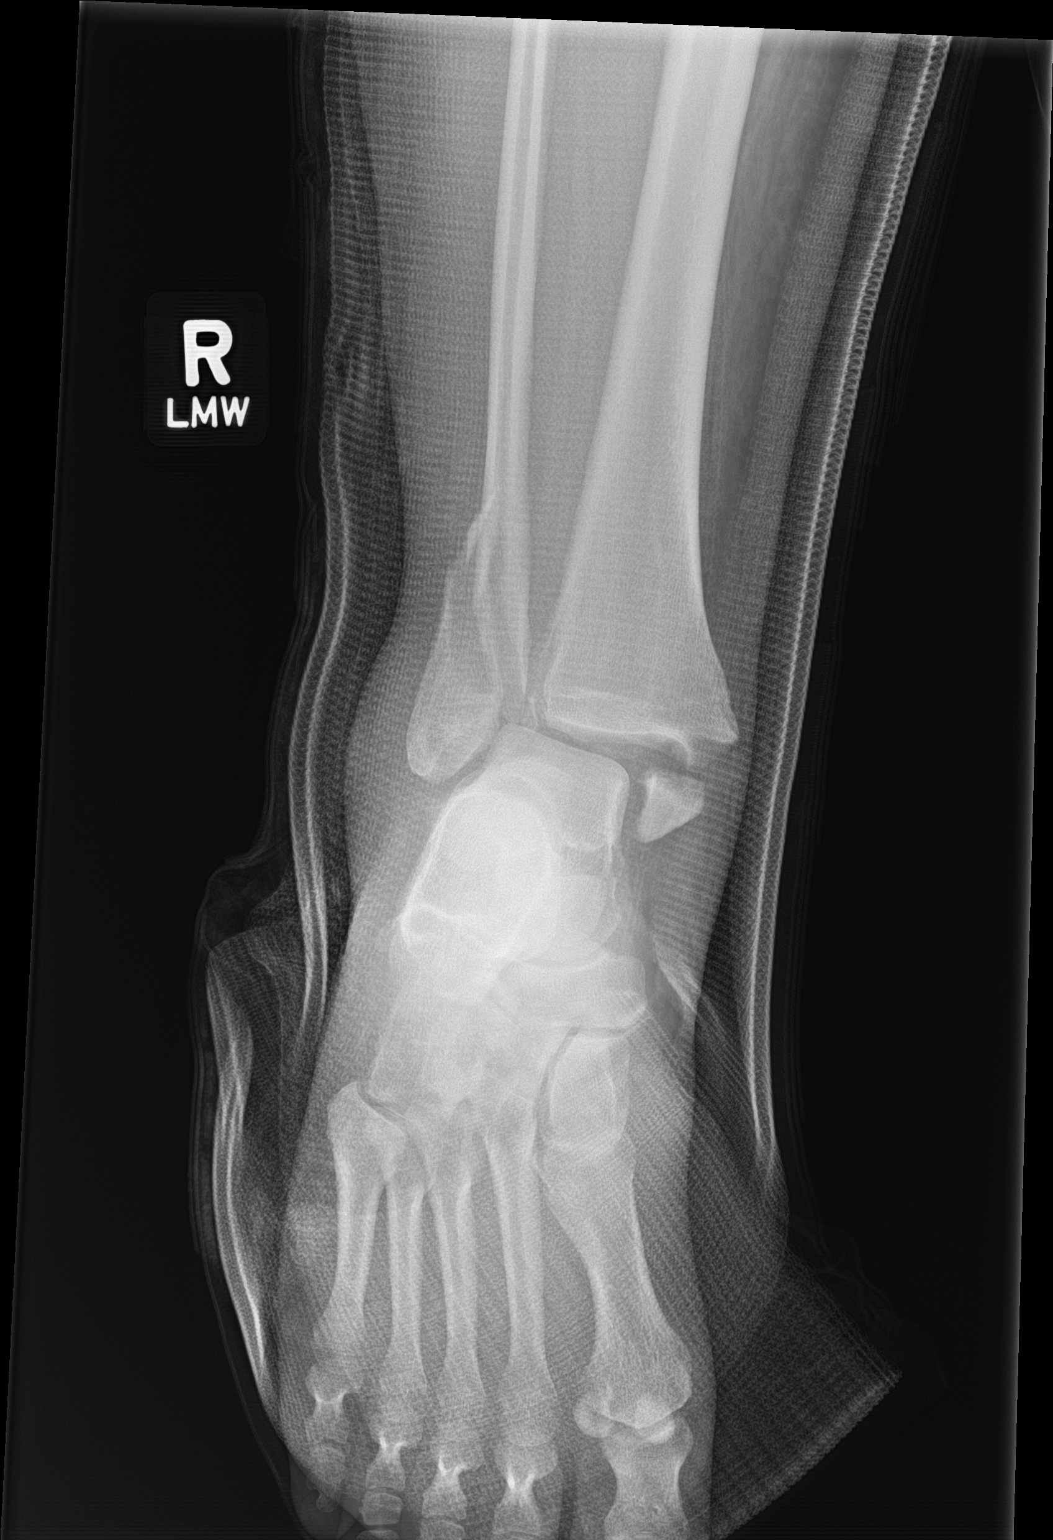

[3 of 3 positions shown; findings below may reference images not displayed]

FINDINGS: There is mildly improved alignment of the bimalleolar fracture,
though there is persistent lateral displacement of the distal
fibular and medial malleolar fracture fragments, and lateral talar
tilt and subluxation. No new fractures are seen. The soft tissues
are difficult to fully assess due to the surrounding splint.
IMPRESSION: Mildly improved alignment of the bimalleolar fracture, though there
is persistent lateral displacement of fracture fragments and lateral
talar tilt and subluxation.

## 2019-03-27 ENCOUNTER — Other Ambulatory Visit: Payer: Self-pay

## 2019-03-27 ENCOUNTER — Emergency Department
Admission: EM | Admit: 2019-03-27 | Discharge: 2019-03-27 | Disposition: A | Discharge status: Home / Self Care | Payer: Self-pay | Attending: Emergency Medicine | Admitting: Emergency Medicine

## 2019-03-27 ENCOUNTER — Emergency Department: Payer: Self-pay

## 2019-03-27 DIAGNOSIS — N83291 Other ovarian cyst, right side: Secondary | ICD-10-CM | POA: Insufficient documentation

## 2019-03-27 DIAGNOSIS — F17210 Nicotine dependence, cigarettes, uncomplicated: Secondary | ICD-10-CM | POA: Insufficient documentation

## 2019-03-27 DIAGNOSIS — I2699 Other pulmonary embolism without acute cor pulmonale: Secondary | ICD-10-CM | POA: Insufficient documentation

## 2019-03-27 DIAGNOSIS — N83201 Unspecified ovarian cyst, right side: Secondary | ICD-10-CM

## 2019-03-27 DIAGNOSIS — B9689 Other specified bacterial agents as the cause of diseases classified elsewhere: Secondary | ICD-10-CM

## 2019-03-27 DIAGNOSIS — R102 Pelvic and perineal pain: Secondary | ICD-10-CM

## 2019-03-27 DIAGNOSIS — Z7901 Long term (current) use of anticoagulants: Secondary | ICD-10-CM | POA: Insufficient documentation

## 2019-03-27 DIAGNOSIS — N760 Acute vaginitis: Secondary | ICD-10-CM | POA: Insufficient documentation

## 2019-03-27 LAB — URINALYSIS, COMPLETE (UACMP) WITH MICROSCOPIC
Bilirubin Urine: NEGATIVE
Glucose, UA: NEGATIVE mg/dL
Hgb urine dipstick: NEGATIVE
Ketones, ur: NEGATIVE mg/dL
Leukocytes,Ua: NEGATIVE
Nitrite: NEGATIVE
Protein, ur: NEGATIVE mg/dL
Specific Gravity, Urine: 1.016 (ref 1.005–1.030)
pH: 7 (ref 5.0–8.0)

## 2019-03-27 LAB — WET PREP, GENITAL
Sperm: NONE SEEN
Trich, Wet Prep: NONE SEEN
Yeast Wet Prep HPF POC: NONE SEEN

## 2019-03-27 LAB — COMPREHENSIVE METABOLIC PANEL
ALT: 55 U/L — ABNORMAL HIGH (ref 0–44)
AST: 73 U/L — ABNORMAL HIGH (ref 15–41)
Albumin: 4 g/dL (ref 3.5–5.0)
Alkaline Phosphatase: 83 U/L (ref 38–126)
Anion gap: 9 (ref 5–15)
BUN: 12 mg/dL (ref 6–20)
CO2: 24 mmol/L (ref 22–32)
Calcium: 9.2 mg/dL (ref 8.9–10.3)
Chloride: 104 mmol/L (ref 98–111)
Creatinine, Ser: 0.77 mg/dL (ref 0.44–1.00)
GFR calc Af Amer: >60 mL/min (ref >60)
GFR calc non Af Amer: >60 mL/min (ref >60)
Glucose, Bld: 102 mg/dL — ABNORMAL HIGH (ref 70–99)
Potassium: 4.1 mmol/L (ref 3.5–5.1)
Sodium: 137 mmol/L (ref 135–145)
Total Bilirubin: 0.4 mg/dL (ref 0.3–1.2)
Total Protein: 7.5 g/dL (ref 6.5–8.1)

## 2019-03-27 LAB — CBC
HCT: 41.6 % (ref 36.0–46.0)
Hemoglobin: 14.3 g/dL (ref 12.0–15.0)
MCH: 31 pg (ref 26.0–34.0)
MCHC: 34.4 g/dL (ref 30.0–36.0)
MCV: 90 fL (ref 80.0–100.0)
Platelets: 324 10*3/uL (ref 150–400)
RBC: 4.62 MIL/uL (ref 3.87–5.11)
RDW: 12.7 % (ref 11.5–15.5)
WBC: 12.7 10*3/uL — ABNORMAL HIGH (ref 4.0–10.5)
nRBC: 0 % (ref 0.0–0.2)

## 2019-03-27 LAB — CHLAMYDIA/NGC RT PCR (ARMC ONLY)
Chlamydia Tr: NOT DETECTED
N gonorrhoeae: NOT DETECTED

## 2019-03-27 LAB — HCG, QUANTITATIVE, PREGNANCY: hCG, Beta Chain, Quant, S: 1 m[IU]/mL (ref <5)

## 2019-03-27 LAB — POCT PREGNANCY, URINE: Preg Test, Ur: NEGATIVE

## 2019-03-27 LAB — LIPASE, BLOOD: Lipase: 26 U/L (ref 11–51)

## 2019-03-27 MED ORDER — SODIUM CHLORIDE 0.9 % IV BOLUS
500.0000 mL | Freq: Once | INTRAVENOUS | Status: AC
  Administered 2019-03-27: 500 mL via INTRAVENOUS

## 2019-03-27 MED ORDER — MORPHINE SULFATE (PF) 4 MG/ML IV SOLN
4.0000 mg | Freq: Once | INTRAVENOUS | Status: AC
  Administered 2019-03-27: 4 mg via INTRAVENOUS
  Filled 2019-03-27: qty 1

## 2019-03-27 MED ORDER — HYDROCODONE-ACETAMINOPHEN 5-325 MG PO TABS: 1.0000 | ORAL_TABLET | ORAL | 0 refills | Status: AC | PRN

## 2019-03-27 MED ORDER — METRONIDAZOLE 500 MG PO TABS: 500.0000 mg | ORAL_TABLET | Freq: Two times a day (BID) | ORAL | 0 refills | Status: AC

## 2019-03-27 MED ORDER — FENTANYL CITRATE (PF) 100 MCG/2ML IJ SOLN
100.0000 ug | Freq: Once | INTRAMUSCULAR | Status: AC
  Administered 2019-03-27: 100 ug via INTRAVENOUS
  Filled 2019-03-27: qty 2

## 2019-03-27 MED ORDER — ONDANSETRON HCL 4 MG/2ML IJ SOLN
4.0000 mg | Freq: Once | INTRAMUSCULAR | Status: AC
  Administered 2019-03-27: 4 mg via INTRAVENOUS
  Filled 2019-03-27: qty 2

## 2019-03-27 NOTE — ED Provider Notes (Signed)
-----------------------------------------   7:09 PM on 03/27/2019 -----------------------------------------  I have seen and evaluated the patient.  Patient is complaining from lower abdominal pain.  Patient's work-up is overall reassuring.  Wet prep is positive for bacterial vaginitis.  We will cover with Flagyl.  Patient's ultrasound has resulted showing a right-sided ovarian cyst otherwise largely nonrevealing.  Patient is to follow-up with her PCP.   Harvest Dark, MD 03/27/19 Pauline Aus

## 2019-03-27 NOTE — ED Provider Notes (Signed)
[facility] Emergency Department Provider Note  ____________________________________________  Time seen: Approximately 4:00 PM  I have reviewed the triage vital signs and the nursing notes.   HISTORY  Chief Complaint Abdominal Pain    HPI Kristina Crosby is a 28 y.o. female who presents to the emergency department for treatment and evaluation of lower abdominal pain that started this morning after intercourse. She denies pain similar to this in the past. No relief with ibuprofen and rest. LMP was 2 months ago, but that is not unusual. Negative pregnancy tests at home.    Past Medical History:  Diagnosis Date  . GERD (gastroesophageal reflux disease)   . Pulmonary embolus [facility](HCC)     Patient Active Problem List   Diagnosis Date Noted  . Ankle fracture, bimalleolar, closed, right, initial encounter 09/15/2017    Past Surgical History:  Procedure Laterality Date  . CHOLECYSTECTOMY    . ORIF ANKLE FRACTURE Right 09/15/2017   Procedure: OPEN REDUCTION INTERNAL FIXATION (ORIF) ANKLE FRACTURE;  Surgeon: Christena FlakePoggi, John J, MD;  Location: [facility] ORS;  Service: Orthopedics;  Laterality: Right;    Prior to Admission medications   Medication Sig Start Date End Date Taking? Authorizing Provider  apixaban (ELIQUIS) 5 MG TABS tablet Take 2 tablets (10 mg) PO BID x 7 days, then reduce dose to 1 tablet (5 mg) PO BID. 10/22/17   Loleta RoseForbach, Cory, MD  enoxaparin (LOVENOX) 40 MG/0.4ML injection Inject 0.4 mLs (40 mg total) into the skin every 12 (twelve) hours. 09/16/17   Anson OregonMcGhee, James Lance, PA-C  HYDROcodone-acetaminophen (NORCO/VICODIN) 5-325 MG tablet Take 1 tablet by mouth every 4 (four) hours as needed for moderate pain. 03/27/19 03/26/20  Chetan Mehring, Kasandra Knudsenari B, FNP  metroNIDAZOLE (FLAGYL) 500 MG tablet Take 1 tablet (500 mg total) by mouth 2 (two) times daily. 03/27/19   Tobey Schmelzle B, FNP  oxyCODONE (OXY IR/ROXICODONE) 5 MG immediate release tablet Take 1-2 tablets (5-10 mg total) by  mouth every 4 (four) hours as needed for moderate pain. 09/16/17   Anson OregonMcGhee, James Lance, PA-C  oxyCODONE-acetaminophen (PERCOCET) 5-325 MG tablet Take 1-2 tablets by mouth every 6 (six) hours as needed for severe pain. 10/22/17   Loleta RoseForbach, Cory, MD    Allergies Patient has no known allergies.  Family History  Problem Relation Age of Onset  . Diabetes Father     Social History Social History   Tobacco Use  . Smoking status: Current Every Day Smoker    Types: Cigarettes  . Smokeless tobacco: Never Used  Substance Use Topics  . Alcohol use: Yes  . Drug use: No    Review of Systems Constitutional: Negative for fever. Respiratory: Negative for shortness of breath or cough. Gastrointestinal: Positive for abdominal pain; negative for nausea , negative for vomiting. Genitourinary: Positive for dysuria , negative for vaginal discharge. Musculoskeletal: Negative for back pain. Skin: Negative for acute skin changes/rash/lesion. ____________________________________________   PHYSICAL EXAM:  VITAL SIGNS: ED Triage Vitals  Enc Vitals Group     BP 03/27/19 1517 138/70     Pulse Rate 03/27/19 1517 94     Resp 03/27/19 1517 (!) 22     Temp 03/27/19 1517 98.9 F (37.2 C)     Temp Source 03/27/19 1517 Oral     SpO2 03/27/19 1517 96 %     Weight 03/27/19 1516 260 lb (117.9 kg)     Height 03/27/19 1516 5\' 2"  (1.575 m)     Head Circumference --      Peak  Flow --      Pain Score 03/27/19 1511 7     Pain Loc --      Pain Edu? --      Excl. in [place]? --     Constitutional: Alert and oriented. Well appearing and in no acute distress. Eyes: Conjunctivae are normal. Head: Atraumatic. Nose: No congestion/rhinnorhea. Mouth/Throat: Mucous membranes are moist. Respiratory: Normal respiratory effort.  No retractions. Gastrointestinal: Bowel sounds active x 4; Abdomen is soft without rebound or guarding. Genitourinary: Pelvic exam: Cervix is smooth, no lesions noted, no discharge, adnexal  tenderness bilaterally-left greater than right. Musculoskeletal: No extremity tenderness nor edema.  Neurologic:  Normal speech and language. No gross focal neurologic deficits are appreciated. Speech is normal. No gait instability. Skin:  Skin is warm, dry and intact. No rash noted on exposed skin. Psychiatric: Mood and affect are normal. Speech and behavior are normal.  ____________________________________________   LABS (all labs ordered are listed, but only abnormal results are displayed)  Labs Reviewed  WET PREP, GENITAL - Abnormal; Notable for the following components:      Result Value   Clue Cells Wet Prep HPF POC PRESENT (*)    WBC, Wet Prep HPF POC FEW (*)    All other components within normal limits  COMPREHENSIVE METABOLIC PANEL - Abnormal; Notable for the following components:   Glucose, Bld 102 (*)    AST 73 (*)    ALT 55 (*)    All other components within normal limits  CBC - Abnormal; Notable for the following components:   WBC 12.7 (*)    All other components within normal limits  URINALYSIS, COMPLETE (UACMP) WITH MICROSCOPIC - Abnormal; Notable for the following components:   Color, Urine YELLOW (*)    APPearance HAZY (*)    Bacteria, UA RARE (*)    All other components within normal limits  CHLAMYDIA/NGC RT PCR ([facility] ONLY)  LIPASE, BLOOD  HCG, QUANTITATIVE, PREGNANCY  POC URINE PREG, ED  POCT PREGNANCY, URINE   ____________________________________________  RADIOLOGY  Ultrasound shows a 2.7 cm complex cystic structure on the right ovary. ____________________________________________  Procedures  ____________________________________________  28 year old female presenting to the ER for treatment of abdominal pain. Suprapubic tenderness on exam. Results of urinalysis are pending. WBC noted to be slightly elevated at 12.7, but this may be a chronic leukocytosis based on review of previous results.  ----------------------------------------- 5:58  PM on 03/27/2019 -----------------------------------------  Pelvic exam completed.  Patient does have significant adnexal tenderness bilaterally, left greater than right.  Plan will be to do pelvic/transvaginal ultrasound to rule out ovarian cyst/ovarian cyst rupture.  Pain is well controlled with earlier dose of fentanyl.  She is tearful after pelvic exam.  She will be given some morphine prior to her ultrasound.  ----------------------------------------- 7:24 PM on 03/27/2019 -----------------------------------------  Lab studies and ultrasound results were reviewed with the patient.  She will be given a referral to gynecology.  She was encouraged to call Monday morning to schedule an appointment.  She will be treated for bacterial vaginosis and provided pain medicine to help with pain related to ovarian cyst.  She was encouraged to avoid intercourse and not to use tampons until she is pain-free.  She is to return to the emergency department for symptoms of change or worsen if she is unable to schedule an appointment with her primary care provider or see the gynecologist.  INITIAL IMPRESSION / [place] / ED COURSE  Pertinent labs & imaging  results that were available during my care of the patient were reviewed by me and considered in my medical decision making (see chart for details).  ____________________________________________   FINAL CLINICAL IMPRESSION(S) / ED DIAGNOSES  Final diagnoses:  Cyst of right ovary  Bacterial vaginosis    Note:  This document was prepared using Dragon voice recognition software and may include unintentional dictation errors.    Chinita Pesterriplett, Yassine Brunsman B, FNP 03/27/19 1925    Minna AntisPaduchowski, Kevin, MD 03/27/19 2211

## 2019-03-27 NOTE — ED Triage Notes (Addendum)
Pt c/o low abdominal pain that started around 0830 this morning, Pt reports nausea with the sxs.   Pt states she has pain with urination as well.    Pt states she has hx of problems with her ovaries.

## 2019-03-27 NOTE — ED Notes (Signed)
Peripheral IV discontinued. Catheter intact. No signs of infiltration or redness. Gauze applied to IV site.   Discharge instructions reviewed with patient. Questions fielded by this RN. Patient verbalizes understanding of instructions. Patient discharged home in stable condition per CariBeth. No acute distress noted at time of discharge.

## 2019-03-27 NOTE — Discharge Instructions (Signed)
Please call and schedule a follow up appointment with gynecology.  Return to the ER for symptoms that change or worsen if unable to see your primary care provider or the gynecologist.

## 2019-03-29 ENCOUNTER — Ambulatory Visit (INDEPENDENT_AMBULATORY_CARE_PROVIDER_SITE_OTHER): Payer: Self-pay | Admitting: Obstetrics and Gynecology

## 2019-03-29 ENCOUNTER — Encounter: Payer: Self-pay | Admitting: Obstetrics and Gynecology

## 2019-03-29 ENCOUNTER — Other Ambulatory Visit: Payer: Self-pay

## 2019-03-29 VITALS — BP 137/65 | HR 92 | Ht 63.0 in | Wt 266.9 lb

## 2019-03-29 DIAGNOSIS — R102 Pelvic and perineal pain: Secondary | ICD-10-CM

## 2019-03-29 NOTE — Progress Notes (Signed)
HPI:      Ms. Kristina Crosby is a 28 y.o. No obstetric history on file. who LMP was Patient's last menstrual period was 01/21/2019.  Subjective:   She presents today after being seen in the ED for pelvic pain.  This was acute onset of pelvic pain following intercourse.  An ultrasound at that time revealed a small ovarian cyst likely functional cyst.  There was a question whether or not cystic rupture had occurred during sex. In addition patient is trying to get pregnant at this time so not using any birth control.  She states that her periods have been somewhat irregular.  She will have 2 or 3 months of periods normal and then skip a month.    Hx: The following portions of the patient's history were reviewed and updated as appropriate:             She  has a past medical history of GERD (gastroesophageal reflux disease) and Pulmonary embolus (HCC). She does not have any pertinent problems on file. She  has a past surgical history that includes Cholecystectomy and ORIF ankle fracture (Right, 09/15/2017). Her family history includes Diabetes in her father. She  reports that she has been smoking cigarettes. She has never used smokeless tobacco. She reports current alcohol use. She reports that she does not use drugs. She has a current medication list which includes the following prescription(s): apixaban, enoxaparin, hydrocodone-acetaminophen, metronidazole, oxycodone, and oxycodone-acetaminophen. She has No Known Allergies.       Review of Systems:  Review of Systems  Constitutional: Denied constitutional symptoms, night sweats, recent illness, fatigue, fever, insomnia and weight loss.  Eyes: Denied eye symptoms, eye pain, photophobia, vision change and visual disturbance.  Ears/Nose/Throat/Neck: Denied ear, nose, throat or neck symptoms, hearing loss, nasal discharge, sinus congestion and sore throat.  Cardiovascular: Denied cardiovascular symptoms, arrhythmia, chest pain/pressure, edema,  exercise intolerance, orthopnea and palpitations.  Respiratory: Denied pulmonary symptoms, asthma, pleuritic pain, productive sputum, cough, dyspnea and wheezing.  Gastrointestinal: Denied, gastro-esophageal reflux, melena, nausea and vomiting.  Genitourinary: See HPI for additional information.  Musculoskeletal: Denied musculoskeletal symptoms, stiffness, swelling, muscle weakness and myalgia.  Dermatologic: Denied dermatology symptoms, rash and scar.  Neurologic: Denied neurology symptoms, dizziness, headache, neck pain and syncope.  Psychiatric: Denied psychiatric symptoms, anxiety and depression.  Endocrine: Denied endocrine symptoms including hot flashes and night sweats.   Meds:   Current Outpatient Medications on File Prior to Visit  Medication Sig Dispense Refill  . apixaban (ELIQUIS) 5 MG TABS tablet Take 2 tablets (10 mg) PO BID x 7 days, then reduce dose to 1 tablet (5 mg) PO BID. 60 tablet 2  . enoxaparin (LOVENOX) 40 MG/0.4ML injection Inject 0.4 mLs (40 mg total) into the skin every 12 (twelve) hours. 28 Syringe 1  . HYDROcodone-acetaminophen (NORCO/VICODIN) 5-325 MG tablet Take 1 tablet by mouth every 4 (four) hours as needed for moderate pain. 20 tablet 0  . metroNIDAZOLE (FLAGYL) 500 MG tablet Take 1 tablet (500 mg total) by mouth 2 (two) times daily. 14 tablet 0  . oxyCODONE (OXY IR/ROXICODONE) 5 MG immediate release tablet Take 1-2 tablets (5-10 mg total) by mouth every 4 (four) hours as needed for moderate pain. 60 tablet 0  . oxyCODONE-acetaminophen (PERCOCET) 5-325 MG tablet Take 1-2 tablets by mouth every 6 (six) hours as needed for severe pain. 30 tablet 0   No current facility-administered medications on file prior to visit.     Objective:     Vitals:  03/29/19 0952  BP: 137/65  Pulse: 92              Ultrasound results reviewed directly with the patient   2.7 cm complex cystic structure on the right ovary, which may represent a degenerating physiologic  cyst or corpus luteal cyst. Follow-up in 6-8 weeks may be considered.   Assessment:    No obstetric history on file. Patient Active Problem List   Diagnosis Date Noted  . Ankle fracture, bimalleolar, closed, right, initial encounter 09/15/2017     1. Pelvic pain in female     Possible small ovarian cyst-likely functional. -Recommend follow-up in 6 weeks.  Expect pelvic discomfort to decrease over the next few days. 2.  Patient attempting pregnancy.  We have discussed timing of intercourse use of prenatal vitamins prior to conception, irregular cycles, work-up including hormonal work-up.   Plan:            1.  Expect pain to resolve over the next few days-follow-up ultrasound in 6 weeks  2.  We have discussed timing of intercourse use of prenatal vitamins prior to conception, irregular cycles, work-up including hormonal work-up.   Orders Orders Placed This Encounter  Procedures  . US PELVIS TRANSVAGINAL NON-OB (TV ONLY)  . US PELVIS (TRANSABDOMINAL ONLY)    No orders of the defined types were placed in this encounter.     F/U  No follow-ups on file. I spent 22 minutes involved in the care of this patient of which greater than 50% was spent discussing ED visit, pelvic pain, ovarian cysts functional versus pathologic, preconceptual counseling, timing of intercourse etc.  Finis Bud, M.D. 03/29/2019 10:10 AM

## 2019-03-29 NOTE — Progress Notes (Signed)
Patient comes in today for ED follow up for ovarian cyst. She is currently trying to get pregnant.

## 2019-05-10 ENCOUNTER — Other Ambulatory Visit: Payer: Self-pay

## 2021-02-06 ENCOUNTER — Emergency Department
Admission: EM | Admit: 2021-02-06 | Discharge: 2021-02-06 | Disposition: A | Discharge status: Home / Self Care | Payer: Self-pay | Attending: Emergency Medicine | Admitting: Emergency Medicine

## 2021-02-06 ENCOUNTER — Other Ambulatory Visit: Payer: Self-pay

## 2021-02-06 DIAGNOSIS — F17210 Nicotine dependence, cigarettes, uncomplicated: Secondary | ICD-10-CM | POA: Insufficient documentation

## 2021-02-06 DIAGNOSIS — Z28310 Unvaccinated for covid-19: Secondary | ICD-10-CM | POA: Insufficient documentation

## 2021-02-06 DIAGNOSIS — J069 Acute upper respiratory infection, unspecified: Secondary | ICD-10-CM | POA: Insufficient documentation

## 2021-02-06 DIAGNOSIS — Z20822 Contact with and (suspected) exposure to covid-19: Secondary | ICD-10-CM | POA: Insufficient documentation

## 2021-02-06 DIAGNOSIS — J029 Acute pharyngitis, unspecified: Secondary | ICD-10-CM

## 2021-02-06 LAB — GROUP A STREP BY PCR: Group A Strep by PCR: NOT DETECTED

## 2021-02-06 MED ORDER — DIPHENHYDRAMINE HCL 12.5 MG/5ML PO ELIX
12.5000 mg | ORAL_SOLUTION | Freq: Once | ORAL | Status: AC
  Administered 2021-02-06: 12.5 mg via ORAL
  Filled 2021-02-06: qty 5

## 2021-02-06 MED ORDER — LIDOCAINE VISCOUS HCL 2 % MT SOLN: 5.0000 mL | Freq: Four times a day (QID) | OROMUCOSAL | 0 refills | Status: AC | PRN

## 2021-02-06 MED ORDER — KETOROLAC TROMETHAMINE 60 MG/2ML IM SOLN
60.0000 mg | Freq: Once | INTRAMUSCULAR | Status: AC
  Administered 2021-02-06: 60 mg via INTRAMUSCULAR
  Filled 2021-02-06: qty 2

## 2021-02-06 MED ORDER — PSEUDOEPH-BROMPHEN-DM 30-2-10 MG/5ML PO SYRP: 5.0000 mL | ORAL_SOLUTION | Freq: Four times a day (QID) | ORAL | 0 refills | Status: AC | PRN

## 2021-02-06 MED ORDER — LIDOCAINE VISCOUS HCL 2 % MT SOLN
15.0000 mL | Freq: Once | OROMUCOSAL | Status: AC
  Administered 2021-02-06: 15 mL via OROMUCOSAL
  Filled 2021-02-06: qty 15

## 2021-02-06 MED ORDER — NAPROXEN 500 MG PO TABS: 500.0000 mg | ORAL_TABLET | Freq: Two times a day (BID) | ORAL | Status: AC

## 2021-02-06 NOTE — ED Provider Notes (Signed)
[facility] Emergency Department Provider Note   ____________________________________________   Event Date/Time   First MD Initiated Contact with Patient 02/06/21 1349     (approximate)  I have reviewed the triage vital signs and the nursing notes.   HISTORY  Chief Complaint Sore Throat    HPI Kristina Crosby is a 30 y.o. female patient complaint sore throat, fever, body aches for 2 to 3 days.  Patient stated worsening in the past 24 hours.  Patient also complains of nasal congestion and a nonproductive cough.  Patient can tolerate liquids and has mild discomfort with solid foods.  Denies recent travel or known contact with COVID-19.  Patient has not taken vaccine for COVID-19 or flu.  Rates her pain a 7/10.  Described pain as "achy".  No palliative measure for complaint.  Verified patient no longer taking blood thinners.         Past Medical History:  Diagnosis Date  . GERD (gastroesophageal reflux disease)   . Pulmonary embolus [facility])     Patient Active Problem List   Diagnosis Date Noted  . Ankle fracture, bimalleolar, closed, right, initial encounter 09/15/2017    Past Surgical History:  Procedure Laterality Date  . CHOLECYSTECTOMY    . ORIF ANKLE FRACTURE Right 09/15/2017   Procedure: OPEN REDUCTION INTERNAL FIXATION (ORIF) ANKLE FRACTURE;  Surgeon: Christena Flake, MD;  Location: [facility] ORS;  Service: Orthopedics;  Laterality: Right;    Prior to Admission medications   Medication Sig Start Date End Date Taking? Authorizing Provider  brompheniramine-pseudoephedrine-DM 30-2-10 MG/5ML syrup Take 5 mLs by mouth 4 (four) times daily as needed. Mix with 5 mL of viscous lidocaine for swish and swallow. 02/06/21  Yes Joni Reining, PA-C  lidocaine (XYLOCAINE) 2 % solution Use as directed 5 mLs in the mouth or throat every 6 (six) hours as needed for mouth pain. Mix with 5 mL of Bromfed-DM for swish and swallow. 02/06/21  Yes Joni Reining, PA-C   naproxen (NAPROSYN) 500 MG tablet Take 1 tablet (500 mg total) by mouth 2 (two) times daily with a meal. 02/06/21  Yes Joni Reining, PA-C  apixaban (ELIQUIS) 5 MG TABS tablet Take 2 tablets (10 mg) PO BID x 7 days, then reduce dose to 1 tablet (5 mg) PO BID. 10/22/17   Loleta Rose, MD  enoxaparin (LOVENOX) 40 MG/0.4ML injection Inject 0.4 mLs (40 mg total) into the skin every 12 (twelve) hours. 09/16/17   Anson Oregon, PA-C  metroNIDAZOLE (FLAGYL) 500 MG tablet Take 1 tablet (500 mg total) by mouth 2 (two) times daily. 03/27/19   Triplett, Cari B, FNP  oxyCODONE (OXY IR/ROXICODONE) 5 MG immediate release tablet Take 1-2 tablets (5-10 mg total) by mouth every 4 (four) hours as needed for moderate pain. 09/16/17   Anson Oregon, PA-C  oxyCODONE-acetaminophen (PERCOCET) 5-325 MG tablet Take 1-2 tablets by mouth every 6 (six) hours as needed for severe pain. 10/22/17   Loleta Rose, MD    Allergies Patient has no known allergies.  Family History  Problem Relation Age of Onset  . Diabetes Father     Social History Social History   Tobacco Use  . Smoking status: Current Every Day Smoker    Types: Cigarettes  . Smokeless tobacco: Never Used  Vaping Use  . Vaping Use: Never used  Substance Use Topics  . Alcohol use: Yes  . Drug use: No    Review of Systems Constitutional: No fever/chills Eyes: No  visual changes. ENT: No sore throat. Cardiovascular: Denies chest pain. Respiratory: Denies shortness of breath. Gastrointestinal: No abdominal pain.  No nausea, no vomiting.  No diarrhea.  No constipation. Genitourinary: Negative for dysuria. Musculoskeletal: Negative for back pain. Skin: Negative for rash. Neurological: Negative for headaches, focal weakness or numbness. ____________________________________________   PHYSICAL EXAM:  VITAL SIGNS: ED Triage Vitals  Enc Vitals Group     BP 02/06/21 1354 136/80     Pulse Rate 02/06/21 1354 96     Resp 02/06/21 1354 17      Temp 02/06/21 1354 97.8 F (36.6 C)     Temp Source 02/06/21 1354 Oral     SpO2 02/06/21 1354 97 %     Weight 02/06/21 1355 260 lb (117.9 kg)     Height 02/06/21 1355 5\' 3"  (1.6 m)     Head Circumference --      Peak Flow --      Pain Score 02/06/21 1355 7     Pain Loc --      Pain Edu? --      Excl. in [place]? --    Constitutional: Alert and oriented. Well appearing and in no acute distress. Eyes: Conjunctivae are normal. PERRL. EOMI. Head: Atraumatic. Nose: No congestion/rhinnorhea. Mouth/Throat: Mucous membranes are moist.  Oropharynx erythematous.  Edematous tonsils without exudate. Neck: No stridor.  No cervical spine tenderness to palpation. Hematological/Lymphatic/Immunilogical: No cervical lymphadenopathy. Cardiovascular: Normal rate, regular rhythm. Grossly normal heart sounds.  Good peripheral circulation. Respiratory: Normal respiratory effort.  No retractions. Lungs CTAB. Gastrointestinal: Soft and nontender.  Distention secondary to body habitus.  No abdominal bruits. No CVA tenderness. Genitourinary: Deferred Musculoskeletal: No lower extremity tenderness nor edema.  No joint effusions. Neurologic:  Normal speech and language. No gross focal neurologic deficits are appreciated. No gait instability. Skin:  Skin is warm, dry and intact. No rash noted. Psychiatric: Mood and affect are normal. Speech and behavior are normal.  ____________________________________________   LABS (all labs ordered are listed, but only abnormal results are displayed)  Labs Reviewed  GROUP A STREP BY PCR  SARS CORONAVIRUS 2 (TAT 6-24 HRS)   ____________________________________________  EKG   ____________________________________________  RADIOLOGY 02/08/21, personally viewed and evaluated these images (plain radiographs) as part of my medical decision making, as well as reviewing the written report by the radiologist.  ED MD interpretation:    Official radiology  report(s): No results found.  ____________________________________________   PROCEDURES  Procedure(s) performed (including Critical Care):  Procedures   ____________________________________________   INITIAL IMPRESSION / ASSESSMENT AND PLAN / ED COURSE  As part of my medical decision making, I reviewed the following data within the electronic MEDICAL RECORD NUMBER         Patient complains of sore throat, fever, and body for 2 to 3 days.  Patient rapid strep test was negative.  Advised patient COVID-19 test results can be viewed later in the MyChart app.  Patient complaint and physical exam consistent with viral pharyngitis and upper respiratory infection.  Patient given discharge care instruction advised take medication as directed.      ____________________________________________   FINAL CLINICAL IMPRESSION(S) / ED DIAGNOSES  Final diagnoses:  Upper respiratory tract infection, unspecified type  Sore throat     ED Discharge Orders         Ordered    lidocaine (XYLOCAINE) 2 % solution  Every 6 hours PRN        02/06/21 1619  brompheniramine-pseudoephedrine-DM 30-2-10 MG/5ML syrup  4 times daily PRN        02/06/21 1619    naproxen (NAPROSYN) 500 MG tablet  2 times daily with meals        02/06/21 1620          *Please note:  Kristina Crosby was evaluated in Emergency Department on 02/06/2021 for the symptoms described in the history of present illness. She was evaluated in the context of the global COVID-19 pandemic, which necessitated consideration that the patient might be at risk for infection with the SARS-CoV-2 virus that causes COVID-19. Institutional protocols and algorithms that pertain to the evaluation of patients at risk for COVID-19 are in a state of rapid change based on information released by regulatory bodies including the CDC and federal and state organizations. These policies and algorithms were followed during the patient's care in the ED.  Some  ED evaluations and interventions may be delayed as a result of limited staffing during and the pandemic.*   Note:  This document was prepared using Dragon voice recognition software and may include unintentional dictation errors.    Joni Reining, PA-C 02/06/21 1624    [place]ton [place], MD 02/07/21 [phone]

## 2021-02-06 NOTE — ED Triage Notes (Signed)
Pt c/o sore throat with fever and body aches for the past 2-3 days

## 2021-02-06 NOTE — Discharge Instructions (Addendum)
Follow discharge care instructions and take medication as directed. 

## 2021-02-07 LAB — SARS CORONAVIRUS 2 (TAT 6-24 HRS): SARS Coronavirus 2: NEGATIVE

## 2024-01-29 ENCOUNTER — Telehealth: Payer: Self-pay | Admitting: Nurse Practitioner

## 2024-01-29 DIAGNOSIS — R110 Nausea: Secondary | ICD-10-CM

## 2024-01-29 MED ORDER — ONDANSETRON 4 MG PO TBDP: 4.0000 mg | ORAL_TABLET | Freq: Three times a day (TID) | ORAL | 0 refills | Status: AC | PRN

## 2024-01-29 NOTE — Progress Notes (Signed)
 E-Visit for Nausea and Vomiting   We are sorry that you are not feeling well. Here is how we plan to help!  Based on what you have shared with me it looks like you have a Virus that is irritating your GI tract.  Vomiting is the forceful emptying of a portion of the stomach's content through the mouth.  Although nausea and vomiting can make you feel miserable, it's important to remember that these are not diseases, but rather symptoms of an underlying illness.  When we treat short term symptoms, we always caution that any symptoms that persist should be fully evaluated in a medical office.  I have prescribed a medication that will help alleviate your symptoms and allow you to stay hydrated:  Zofran 4 mg 1 tablet every 8 hours as needed for nausea and vomiting  HOME CARE: Drink clear liquids.  This is very important! Dehydration (the lack of fluid) can lead to a serious complication.  Start off with 1 tablespoon every 5 minutes for 8 hours. You may begin eating bland foods after 8 hours without vomiting.  Start with saltine crackers, white bread, rice, mashed potatoes, applesauce. After 48 hours on a bland diet, you may resume a normal diet. Try to go to sleep.  Sleep often empties the stomach and relieves the need to vomit.  GET HELP RIGHT AWAY IF:  Your symptoms do not improve or worsen within 2 days after treatment. You have a fever for over 3 days. You cannot keep down fluids after trying the medication.  MAKE SURE YOU:  Understand these instructions. Will watch your condition. Will get help right away if you are not doing well or get worse.    Thank you for choosing an e-visit.  Your e-visit answers were reviewed by a board certified advanced clinical practitioner to complete your personal care plan. Depending upon the condition, your plan could have included both over the counter or prescription medications.  Please review your pharmacy choice. Make sure the pharmacy is open so  you can pick up prescription now. If there is a problem, you may contact your provider through [employer] and have the prescription routed to another pharmacy.  Your safety is important to [place]. If you have drug allergies check your prescription carefully.   For the next 24 hours you can use MyChart to ask questions about today's visit, request a non-urgent call back, or ask for a work or school excuse. You will get an email in the next two days asking about your experience. I hope that your e-visit has been valuable and will speed your recovery.   I spent approximately 5 minutes reviewing the patient's history, current symptoms and coordinating their care today.
